# Patient Record
Sex: Male | Born: 1965 | Race: White | Hispanic: No | Marital: Married | Smoking: Current every day smoker
Health system: Southern US, Community
[De-identification: ages and names within clinical notes are randomized; demographics above are authoritative.]

## PROBLEM LIST (undated history)

## (undated) DIAGNOSIS — F1010 Alcohol abuse, uncomplicated: Secondary | ICD-10-CM

## (undated) DIAGNOSIS — I259 Chronic ischemic heart disease, unspecified: Secondary | ICD-10-CM

## (undated) DIAGNOSIS — Z720 Tobacco use: Secondary | ICD-10-CM

## (undated) DIAGNOSIS — E785 Hyperlipidemia, unspecified: Secondary | ICD-10-CM

## (undated) DIAGNOSIS — I10 Essential (primary) hypertension: Secondary | ICD-10-CM

## (undated) HISTORY — DX: Alcohol abuse, uncomplicated: F10.10

## (undated) HISTORY — DX: Hyperlipidemia, unspecified: E78.5

## (undated) HISTORY — DX: Tobacco use: Z72.0

## (undated) HISTORY — DX: Chronic ischemic heart disease, unspecified: I25.9

## (undated) HISTORY — PX: CARDIAC CATHETERIZATION: SHX172

---

## 2001-07-07 ENCOUNTER — Emergency Department (HOSPITAL_COMMUNITY): Admission: EM | Admit: 2001-07-07 | Discharge: 2001-07-08 | Payer: Self-pay

## 2005-12-26 ENCOUNTER — Encounter: Admission: RE | Admit: 2005-12-26 | Discharge: 2005-12-26 | Payer: Self-pay | Admitting: Endocrinology

## 2007-11-25 ENCOUNTER — Encounter: Admission: RE | Admit: 2007-11-25 | Discharge: 2007-11-25 | Payer: Self-pay | Admitting: Endocrinology

## 2008-01-16 ENCOUNTER — Encounter: Admission: RE | Admit: 2008-01-16 | Discharge: 2008-01-16 | Payer: Self-pay | Admitting: Gastroenterology

## 2009-03-12 ENCOUNTER — Inpatient Hospital Stay (HOSPITAL_COMMUNITY): Admission: EM | Admit: 2009-03-12 | Discharge: 2009-03-14 | Payer: Self-pay | Admitting: Emergency Medicine

## 2009-03-12 ENCOUNTER — Ambulatory Visit: Payer: Self-pay | Admitting: Internal Medicine

## 2009-03-21 ENCOUNTER — Telehealth: Payer: Self-pay | Admitting: Internal Medicine

## 2009-03-24 ENCOUNTER — Ambulatory Visit: Payer: Self-pay | Admitting: Internal Medicine

## 2009-03-24 DIAGNOSIS — R002 Palpitations: Secondary | ICD-10-CM

## 2009-03-24 DIAGNOSIS — E785 Hyperlipidemia, unspecified: Secondary | ICD-10-CM | POA: Insufficient documentation

## 2009-03-24 DIAGNOSIS — I10 Essential (primary) hypertension: Secondary | ICD-10-CM

## 2009-03-24 DIAGNOSIS — R079 Chest pain, unspecified: Secondary | ICD-10-CM | POA: Insufficient documentation

## 2009-04-18 ENCOUNTER — Telehealth: Payer: Self-pay | Admitting: Internal Medicine

## 2010-02-09 NOTE — Progress Notes (Signed)
Summary: pt needs refill  Phone Note Refill Request Call back at Work Phone [phone] Message from:  [facility] on [address] [phone]  Refills Requested: Medication #1:  METOPROLOL TARTRATE 25 MG TABS Take one tablet by mouth twice a day Initial call taken by: Omer Jack,  April 18, 2009 11:48 AM    Prescriptions: METOPROLOL TARTRATE 25 MG TABS (METOPROLOL TARTRATE) Take one tablet by mouth twice a day  #0 x 0   Entered by:   Laurance Flatten CMA   Authorized by:   Laren Boom, MD, [facility]   Signed by:   Laurance Flatten CMA on 04/18/2009   Method used:   Print then Give to Patient   RxID:   [id number]

## 2010-02-09 NOTE — Assessment & Plan Note (Signed)
Summary: eph/post cath/eval for monitor   Visit Type:  Initial Consult Primary Provider:  Juleen China, MD   History of Present Illness: Mr. Varnell returns today for followup.  He is a pleasant 45 yo man with a h/o tachypalpitations associated with chest pain who underwent left heart catheterization which demonstrated no obstructive disease.  He has been stable.  He has been trying to stop smoking and lose weight. He has not had any increased heart rates since discharge and has gone back to work.  Current Medications (verified): 1)  Metoprolol Tartrate 25 Mg Tabs (Metoprolol Tartrate) .... Take One Tablet By Mouth Twice A Day 2)  Cyclobenzaprine Hcl 10 Mg Tabs (Cyclobenzaprine Hcl) .... Three Times A Day As Needed 3)  Lorazepam 1 Mg Tabs (Lorazepam) .... At Bedtime 4)  Multivitamins   Tabs (Multiple Vitamin) .... Take One Tablet By Mouth Once Daily. 5)  Nexium 40 Mg Cpdr (Esomeprazole Magnesium) .Marland Kitchen.. 1 Capsule Once Daily 6)  Simcor 500-20 Mg Xr24h-Tab (Niacin-Simvastatin) .... Take One Tablet By Mouth Once Daily. 7)  Testim 1 % Gel (Testosterone) .... Once Daily 8)  Vitamin B-12 1000 Mcg Tabs (Cyanocobalamin) .... Take One Tablet By Mouth Once Daily.  Allergies (verified): No Known Drug Allergies  Review of Systems  The patient denies chest pain, syncope, dyspnea on exertion, and peripheral edema.    Vital Signs:  Patient profile:   45 year old male Height:      72 inches Weight:      222 pounds BMI:     30.22 Pulse rate:   72 / minute BP sitting:   120 / 70  (left arm)  Vitals Entered By: Laurance Flatten CMA (March 24, 2009 2:10 PM)  Physical Exam  General:  Well developed, well nourished, in no acute distress.  HEENT: normal Neck: supple. No JVD. Carotids 2+ bilaterally no bruits Cor: RRR no rubs, gallops or murmur Lungs: CTA Ab: soft, nontender. nondistended. No HSM. Good bowel sounds Ext: warm. no cyanosis, clubbing or edema Neuro: alert and oriented. Grossly nonfocal. affect  pleasant    EKG  Procedure date:  03/24/2009  Findings:      Normal sinus rhythm with rate of:  72.  Impression & Recommendations:  Problem # 1:  PALPITATIONS (ICD-785.1) Most likely the patient has SVT but we have not been able to document an episode.  I have recommended he go to the firestation or to our office if he has more symptoms and to record his heart rate and blood pressure if and when he goes back to atrial fibrillation. His updated medication list for this problem includes:    Metoprolol Tartrate 25 Mg Tabs (Metoprolol tartrate) .Marland Kitchen... Take one tablet by mouth twice a day  Problem # 2:  ESSENTIAL HYPERTENSION, BENIGN (ICD-401.1) His blood pressure today is well controlled.  He may be able to stop his beta blocker if he can lose some additional weight. His updated medication list for this problem includes:    Metoprolol Tartrate 25 Mg Tabs (Metoprolol tartrate) .Marland Kitchen... Take one tablet by mouth twice a day  Problem # 3:  DYSLIPIDEMIA (ICD-272.4) A low fat diet and continued medical therapy have been recommended. His updated medication list for this problem includes:    Simcor 500-20 Mg Xr24h-tab (Niacin-simvastatin) .Marland Kitchen... Take one tablet by mouth once daily.  Patient Instructions: 1)  Your physician recommends that you schedule a follow-up appointment in: 3 to 4 months with Dr Ladona Ridgel 2)  Your physician recommends that you continue  on your current medications as directed. Please refer to the Current Medication list given to you today.

## 2010-02-09 NOTE — Progress Notes (Signed)
Summary: post cath/groin knot  Phone Note Call from Patient Call back at Work Phone [phone]   Caller: Patient Reason for Call: Talk to Nurse Summary of Call: post cath last Monday3/7, has knot @ cath site; about a inch long Initial call taken by: Migdalia Dk,  March 21, 2009 11:00 AM  Follow-up for Phone Call        spoke with pt, he has a knot at the cath site in the groin. he noticed it today, prior to today it has not been there. the site is alittle tender to the touch. he denies any other problems. Deliah Goody, RN  March 21, 2009 11:20 AM  discussed with dr Sanjuana Kava, pt to watch and let us know of any changes Deliah Goody, RN  March 21, 2009 11:26 AM

## 2010-04-02 LAB — LIPID PANEL
HDL: 31 mg/dL — ABNORMAL LOW (ref >39)
LDL Cholesterol: 64 mg/dL (ref 0–99)
Triglycerides: 282 mg/dL — ABNORMAL HIGH (ref <150)

## 2010-04-02 LAB — CARDIAC PANEL(CRET KIN+CKTOT+MB+TROPI)
CK, MB: 2.3 ng/mL (ref 0.3–4.0)
Relative Index: 1 (ref 0.0–2.5)
Relative Index: 1.1 (ref 0.0–2.5)
Troponin I: 0.04 ng/mL (ref 0.00–0.06)
Troponin I: 0.05 ng/mL (ref 0.00–0.06)

## 2010-04-02 LAB — TROPONIN I: Troponin I: 0.13 ng/mL — ABNORMAL HIGH (ref 0.00–0.06)

## 2010-04-02 LAB — BASIC METABOLIC PANEL
BUN: 12 mg/dL (ref 6–23)
Chloride: 104 mEq/L (ref 96–112)
Chloride: 108 mEq/L (ref 96–112)
Chloride: 108 mEq/L (ref 96–112)
GFR calc Af Amer: >60 mL/min (ref >60)
GFR calc Af Amer: >60 mL/min (ref >60)
GFR calc non Af Amer: >60 mL/min (ref >60)
Glucose, Bld: 98 mg/dL (ref 70–99)
Potassium: 3.8 mEq/L (ref 3.5–5.1)
Potassium: 3.9 mEq/L (ref 3.5–5.1)
Potassium: 4.2 mEq/L (ref 3.5–5.1)
Sodium: 137 mEq/L (ref 135–145)
Sodium: 138 mEq/L (ref 135–145)
Sodium: 138 mEq/L (ref 135–145)

## 2010-04-02 LAB — CBC
HCT: 44.5 % (ref 39.0–52.0)
HCT: 48.1 % (ref 39.0–52.0)
Hemoglobin: 16.6 g/dL (ref 13.0–17.0)
MCV: 94.9 fL (ref 78.0–100.0)
Platelets: 164 10*3/uL (ref 150–400)
RBC: 5.07 MIL/uL (ref 4.22–5.81)
RDW: 13 % (ref 11.5–15.5)
WBC: 11.6 10*3/uL — ABNORMAL HIGH (ref 4.0–10.5)
WBC: 8.8 10*3/uL (ref 4.0–10.5)

## 2010-04-02 LAB — CK TOTAL AND CKMB (NOT AT ARMC)
CK, MB: 2.5 ng/mL (ref 0.3–4.0)
Total CK: 253 U/L — ABNORMAL HIGH (ref 7–232)

## 2010-04-02 LAB — DIFFERENTIAL
Eosinophils Absolute: 0.4 10*3/uL (ref 0.0–0.7)
Eosinophils Relative: 3 % (ref 0–5)
Lymphs Abs: 2.6 10*3/uL (ref 0.7–4.0)
Monocytes Relative: 8 % (ref 3–12)

## 2010-04-02 LAB — PROTIME-INR
INR: 0.97 (ref 0.00–1.49)
Prothrombin Time: 12.8 seconds (ref 11.6–15.2)

## 2010-04-02 LAB — POCT CARDIAC MARKERS
Troponin i, poc: <0.05 ng/mL (ref 0.00–0.09)
Troponin i, poc: <0.05 ng/mL (ref 0.00–0.09)

## 2010-04-02 LAB — TSH: TSH: 1.591 u[IU]/mL (ref 0.350–4.500)

## 2010-04-02 LAB — D-DIMER, QUANTITATIVE: D-Dimer, Quant: <0.22 ug/mL-FEU (ref 0.00–0.48)

## 2010-09-07 ENCOUNTER — Other Ambulatory Visit: Payer: Self-pay | Admitting: Gastroenterology

## 2010-09-08 ENCOUNTER — Ambulatory Visit
Admission: RE | Admit: 2010-09-08 | Discharge: 2010-09-08 | Disposition: A | Discharge status: Home / Self Care | Payer: PRIVATE HEALTH INSURANCE | Source: Ambulatory Visit | Attending: Gastroenterology | Admitting: Gastroenterology

## 2011-09-18 ENCOUNTER — Emergency Department (HOSPITAL_COMMUNITY)
Admission: EM | Admit: 2011-09-18 | Discharge: 2011-09-18 | Disposition: A | Discharge status: Home / Self Care | Payer: PRIVATE HEALTH INSURANCE | Attending: Emergency Medicine | Admitting: Emergency Medicine

## 2011-09-18 ENCOUNTER — Encounter (HOSPITAL_COMMUNITY): Payer: Self-pay | Admitting: *Deleted

## 2011-09-18 ENCOUNTER — Emergency Department (HOSPITAL_COMMUNITY): Payer: PRIVATE HEALTH INSURANCE

## 2011-09-18 DIAGNOSIS — S63501A Unspecified sprain of right wrist, initial encounter: Secondary | ICD-10-CM

## 2011-09-18 DIAGNOSIS — I10 Essential (primary) hypertension: Secondary | ICD-10-CM | POA: Insufficient documentation

## 2011-09-18 DIAGNOSIS — Y998 Other external cause status: Secondary | ICD-10-CM | POA: Insufficient documentation

## 2011-09-18 DIAGNOSIS — S40019A Contusion of unspecified shoulder, initial encounter: Secondary | ICD-10-CM | POA: Insufficient documentation

## 2011-09-18 DIAGNOSIS — S63509A Unspecified sprain of unspecified wrist, initial encounter: Secondary | ICD-10-CM | POA: Insufficient documentation

## 2011-09-18 DIAGNOSIS — S40012A Contusion of left shoulder, initial encounter: Secondary | ICD-10-CM

## 2011-09-18 DIAGNOSIS — Y9389 Activity, other specified: Secondary | ICD-10-CM | POA: Insufficient documentation

## 2011-09-18 DIAGNOSIS — F17200 Nicotine dependence, unspecified, uncomplicated: Secondary | ICD-10-CM | POA: Insufficient documentation

## 2011-09-18 DIAGNOSIS — W208XXA Other cause of strike by thrown, projected or falling object, initial encounter: Secondary | ICD-10-CM | POA: Insufficient documentation

## 2011-09-18 HISTORY — DX: Essential (primary) hypertension: I10

## 2011-09-18 MED ORDER — OXYCODONE HCL 5 MG PO TABS: 5.0000 mg | ORAL_TABLET | ORAL | Status: AC | PRN

## 2011-09-18 NOTE — ED Notes (Signed)
He was at work and a heavy machine fell on him earlier today.  He has pain in his rt wrist and his lt clavicle

## 2011-09-18 NOTE — ED Notes (Signed)
EDPA at [facility]. Pt alert, NAD, calm, interactive. Imaging results reviewed.

## 2011-09-18 NOTE — ED Notes (Signed)
Pt seen by EDPA prior to RN assessment, see PA notes, orders received and initiated. Splint placed by orthotech. Here for blunt trauma to R wrist. Skin intact, no bruising, redness or markings noted. Swelling present, skin tight in anterior wrist area. CMS intact, radial and ulnar pulses present, cap refill <2sec. Reports moderate pain.

## 2011-09-18 NOTE — ED Provider Notes (Signed)
History     CSN: [id number]  Arrival date & time 09/18/11  1736   First MD Initiated Contact with Patient 09/18/11 2005      Chief Complaint  Patient presents with  . Wrist Pain    (Consider location/radiation/quality/duration/timing/severity/associated sxs/prior treatment) HPI History provided by pt.   Pt was moving a refrigerator at 5pm today, it tipped backwards, patient fell to ground, landing on his back, and refrigerator landed on his right wrist.  C/o pain in right wrist as well as left shoulder.  Denies weakness/paresthesias.  Took three shots of vodka with some relief of the pain.  Hit head but no LOC and denies headache, dizziness, blurred vision and N/V.  Denies neck/back pain.    Past Medical History  Diagnosis Date  . Hypertension     History reviewed. No pertinent past surgical history.  No family history on file.  History  Substance Use Topics  . Smoking status: Current Everyday Smoker  . Smokeless tobacco: Not on file  . Alcohol Use: Yes      Review of Systems  All other systems reviewed and are negative.    Allergies  Tylenol  Home Medications   Current Outpatient Rx  Name Route Sig Dispense Refill  . ARIPIPRAZOLE 2 MG PO TABS Oral Take 2 mg by mouth daily.    . CELECOXIB 200 MG PO CAPS Oral Take 200 mg by mouth daily.    Marland Kitchen LORAZEPAM 1 MG PO TABS Oral Take 1 mg by mouth every 12 (twelve) hours as needed. For anxiety    . SERTRALINE HCL 100 MG PO TABS Oral Take 150 mg by mouth daily.    . TESTOSTERONE 50 MG/5GM TD GEL Transdermal Place 5 g onto the skin daily.      BP 116/75  Pulse 94  Temp 97.9 F (36.6 C) (Oral)  Resp 18  SpO2 99%  Physical Exam  Nursing note and vitals reviewed. Constitutional: He is oriented to person, place, and time. He appears well-developed and well-nourished. No distress.  HENT:  Head: Normocephalic and atraumatic.  Eyes:       Normal appearance  Neck: Normal range of motion.  Cardiovascular: Normal rate,  regular rhythm and intact distal pulses.   Pulmonary/Chest: Effort normal and breath sounds normal.  Musculoskeletal: Normal range of motion.       Edema and mild ecchymosis on flexor surface of right distal forearm.  Full, active ROM of wrist but pain w/ flexion and supination.  Strength testing limited by pain.  Left clavicle tender and there is a palpable knot at the center.  Full passive ROM shoulder w/ minimal pain.  All other joints of upper extremities normal.  2+ radial pulses and sensation intact.  Entire spine non-tender.    Neurological: He is alert and oriented to person, place, and time. No sensory deficit. Coordination normal.       CN 3-12 intact.  No nystagmus. 5/5 and equal upper and lower extremity strength.  No past pointing.     Skin: Skin is warm and dry. No rash noted.  Psychiatric: He has a normal mood and affect. His behavior is normal.    ED Course  Procedures (including critical care time)  Labs Reviewed - No data to display Dg Clavicle Left  09/18/2011  *RADIOLOGY REPORT*  Clinical Data: Fall.  Left clavicle pain.  LEFT CLAVICLE - 2+ VIEWS  Comparison: Two-view chest x-ray 07/02/2011.  Findings: The left clavicle is intact.  Mild degenerative changes  in the left [facility] joint are stable.  IMPRESSION:  1.  No acute fracture. 2.  Mild degenerative changes of the left AC joint.   Original Report Authenticated By: Jamesetta Orleans. MATTERN, M.D.    Dg Wrist Complete Right  09/18/2011  *RADIOLOGY REPORT*  Clinical Data: Fall.  Right wrist pain.  RIGHT WRIST - COMPLETE 3+ VIEW  Comparison: None.  Findings: The right wrist is located.  No acute bone or soft tissue abnormality is present.  IMPRESSION: Negative right wrist.   Original Report Authenticated By: Jamesetta Orleans. MATTERN, M.D.      1. Sprain of right wrist   2. Contusion of left shoulder       MDM  46yo M fell and refrigerator landed on right wrist today.  C/o right wrist and left clavicle pain.  Xrays negative for  fx/dislocation and NS intact and no signs of compartment syndrome.  Ortho tech placed right wrist in velcrow splint and pt d/c'd home w/ 15 oxycodone (can not taken NSAIDs/tylenol) and referral to hand for persistent/worsening sx.  Return precautions discussed.         Arie Sabina [place], [place] 09/18/11 2028

## 2011-09-18 NOTE — Progress Notes (Signed)
Orthopedic Tech Progress Note Patient Details:  Corey Archer Aug 11, 1965 [id number]  Ortho Devices Type of Ortho Device: Velcro wrist splint Ortho Device/Splint Location: right  wrist Ortho Device/Splint Interventions: Application   Corey Archer 09/18/2011, 9:00 PM

## 2011-09-20 NOTE — ED Provider Notes (Signed)
Medical screening examination/treatment/procedure(s) were performed by non-physician practitioner and as supervising physician I was immediately available for consultation/collaboration.   Aaleah Hirsch, MD 09/20/11 0009 

## 2014-10-19 ENCOUNTER — Encounter (HOSPITAL_COMMUNITY): Payer: Self-pay | Admitting: Emergency Medicine

## 2014-10-19 ENCOUNTER — Emergency Department (HOSPITAL_COMMUNITY): Payer: BLUE CROSS/BLUE SHIELD

## 2014-10-19 ENCOUNTER — Emergency Department (HOSPITAL_COMMUNITY)
Admission: EM | Admit: 2014-10-19 | Discharge: 2014-10-19 | Disposition: A | Discharge status: Home / Self Care | Payer: BLUE CROSS/BLUE SHIELD | Attending: Emergency Medicine | Admitting: Emergency Medicine

## 2014-10-19 DIAGNOSIS — R079 Chest pain, unspecified: Secondary | ICD-10-CM | POA: Insufficient documentation

## 2014-10-19 DIAGNOSIS — Z792 Long term (current) use of antibiotics: Secondary | ICD-10-CM | POA: Insufficient documentation

## 2014-10-19 DIAGNOSIS — Z720 Tobacco use: Secondary | ICD-10-CM | POA: Diagnosis not present

## 2014-10-19 DIAGNOSIS — Z79899 Other long term (current) drug therapy: Secondary | ICD-10-CM | POA: Diagnosis not present

## 2014-10-19 DIAGNOSIS — I10 Essential (primary) hypertension: Secondary | ICD-10-CM | POA: Insufficient documentation

## 2014-10-19 DIAGNOSIS — Z9889 Other specified postprocedural states: Secondary | ICD-10-CM | POA: Insufficient documentation

## 2014-10-19 LAB — CBC
HCT: 46.9 % (ref 39.0–52.0)
HEMOGLOBIN: 16.5 g/dL (ref 13.0–17.0)
MCH: 31.5 pg (ref 26.0–34.0)
MCHC: 35.2 g/dL (ref 30.0–36.0)
MCV: 89.5 fL (ref 78.0–100.0)
Platelets: 166 10*3/uL (ref 150–400)
RBC: 5.24 MIL/uL (ref 4.22–5.81)
RDW: 12.1 % (ref 11.5–15.5)
WBC: 7.8 10*3/uL (ref 4.0–10.5)

## 2014-10-19 LAB — URINALYSIS, ROUTINE W REFLEX MICROSCOPIC
BILIRUBIN URINE: NEGATIVE
Glucose, UA: NEGATIVE mg/dL
HGB URINE DIPSTICK: NEGATIVE
KETONES UR: NEGATIVE mg/dL
Leukocytes, UA: NEGATIVE
NITRITE: NEGATIVE
PROTEIN: NEGATIVE mg/dL
SPECIFIC GRAVITY, URINE: 1.004 — AB (ref 1.005–1.030)
Urobilinogen, UA: 0.2 mg/dL (ref 0.0–1.0)
pH: 7 (ref 5.0–8.0)

## 2014-10-19 LAB — BASIC METABOLIC PANEL
ANION GAP: 7 (ref 5–15)
BUN: 11 mg/dL (ref 6–20)
CALCIUM: 9.1 mg/dL (ref 8.9–10.3)
CO2: 26 mmol/L (ref 22–32)
Chloride: 105 mmol/L (ref 101–111)
Creatinine, Ser: 0.93 mg/dL (ref 0.61–1.24)
GFR calc non Af Amer: >60 mL/min (ref >60)
Glucose, Bld: 93 mg/dL (ref 65–99)
Potassium: 4.6 mmol/L (ref 3.5–5.1)
SODIUM: 138 mmol/L (ref 135–145)

## 2014-10-19 LAB — I-STAT TROPONIN, ED
TROPONIN I, POC: 0 ng/mL (ref 0.00–0.08)
TROPONIN I, POC: 0.01 ng/mL (ref 0.00–0.08)

## 2014-10-19 NOTE — ED Provider Notes (Signed)
CSN: [id number]     Arrival date & time 10/19/14  4098 History   First MD Initiated Contact with Patient 10/19/14 [phone]     Chief Complaint  Patient presents with  . Chest Pain  . Hypertension     (Consider location/radiation/quality/duration/timing/severity/associated sxs/prior Treatment) Patient is a 49 y.o. male presenting with chest pain.  Chest Pain Pain location:  L chest Pain quality: shooting   Pain radiates to:  Does not radiate Pain radiates to the back: no   Pain severity:  Moderate Onset quality:  Gradual Timing:  Intermittent Progression:  Waxing and waning Chronicity:  New Relieved by:  Nothing Worsened by:  Nothing tried Associated symptoms: no fever, no nausea, no shortness of breath and not vomiting     Past Medical History  Diagnosis Date  . Hypertension    Past Surgical History  Procedure Laterality Date  . Cardiac catheterization     No family history on file. Social History  Substance Use Topics  . Smoking status: Current Every Day Smoker  . Smokeless tobacco: None  . Alcohol Use: Yes    Review of Systems  Constitutional: Negative for fever.  Respiratory: Negative for shortness of breath.   Cardiovascular: Positive for chest pain.  Gastrointestinal: Negative for nausea and vomiting.  All other systems reviewed and are negative.     Allergies  Motrin and Tylenol  Home Medications   Prior to Admission medications   Medication Sig Start Date End Date Taking? Authorizing Provider  celecoxib (CELEBREX) 200 MG capsule Take 200 mg by mouth daily.   Yes Historical Provider, MD  clarithromycin (BIAXIN XL) 500 MG 24 hr tablet Take 500 mg by mouth 2 (two) times daily. 10 day supply started 10/3 10/11/14  Yes Historical Provider, MD  cyclobenzaprine (FLEXERIL) 10 MG tablet Take 10 mg by mouth 3 (three) times daily as needed for muscle spasms.   Yes Historical Provider, MD  esomeprazole (NEXIUM) 40 MG capsule Take 40 mg by mouth daily at 12 noon.    Yes Historical Provider, MD  hydrOXYzine (ATARAX/VISTARIL) 25 MG tablet Take 25 mg by mouth at bedtime.   Yes Historical Provider, MD  LORazepam (ATIVAN) 1 MG tablet Take 1 mg by mouth at bedtime. For anxiety   Yes Historical Provider, MD  metoprolol tartrate (LOPRESSOR) 25 MG tablet Take 25 mg by mouth 2 (two) times daily.   Yes Historical Provider, MD  testosterone (ANDROGEL) 50 MG/5GM GEL Place 5 g onto the skin daily.   Yes Historical Provider, MD   BP 155/92 mmHg  Pulse 76  Temp(Src) 98.4 F (36.9 C) (Oral)  Resp 16  Ht 6' (1.829 m)  Wt 220 lb (99.791 kg)  BMI 29.83 kg/m2  SpO2 98% Physical Exam  Constitutional: He is oriented to person, place, and time. He appears well-developed and well-nourished.  HENT:  Head: Normocephalic and atraumatic.  Eyes: Conjunctivae and EOM are normal.  Neck: Normal range of motion. Neck supple.  Cardiovascular: Normal rate, regular rhythm and normal heart sounds.   Pulmonary/Chest: Effort normal and breath sounds normal. No respiratory distress.  Abdominal: He exhibits no distension. There is no tenderness. There is no rebound and no guarding.  Musculoskeletal: Normal range of motion.  Neurological: He is alert and oriented to person, place, and time.  Skin: Skin is warm and dry.  Vitals reviewed.   ED Course  Procedures (including critical care time) Labs Review Labs Reviewed  URINALYSIS, ROUTINE W REFLEX MICROSCOPIC (NOT AT [facility]) - Abnormal;  Notable for the following:    Specific Gravity, Urine 1.004 (*)    All other components within normal limits  BASIC METABOLIC PANEL  CBC  I-STAT TROPOININ, ED  I-STAT TROPOININ, ED    Imaging Review No results found. I have personally reviewed and evaluated these images and lab results as part of my medical decision-making.   EKG Interpretation   Date/Time:  Tuesday October 19 2014 09:25:35 EDT Ventricular Rate:  76 PR Interval:  163 QRS Duration: 94 QT Interval:  350 QTC Calculation:  393 R Axis:   104 Text Interpretation:  Sinus rhythm Ventricular premature complex Right  axis deviation rate has decreased since last tracing Confirmed by Mirian Mo [phone]) on 10/19/2014 9:28:24 AM      MDM   Final diagnoses:  Chest pain, unspecified chest pain type    49 y.o. male with pertinent PMH of prior HTN (not currently on medication) presents with chest pain as above, as well as concern for HTN with BP systolic 180 at work.  Physical exam on arrival as above, pt asymptomatic.    Wu obtained as above and unremarkable.    I have reviewed all laboratory and imaging studies if ordered as above  1. Chest pain, unspecified chest pain type         Mirian Mo, MD 10/23/14 [phone]

## 2014-10-19 NOTE — Discharge Instructions (Signed)
Nonspecific Chest Pain  °Chest pain can be caused by many different conditions. There is always a chance that your pain could be related to something serious, such as a heart attack or a blood clot in your lungs. Chest pain can also be caused by conditions that are not life-threatening. If you have chest pain, it is very important to follow up with your health care provider. °CAUSES  °Chest pain can be caused by: °· Heartburn. °· Pneumonia or bronchitis. °· Anxiety or stress. °· Inflammation around your heart (pericarditis) or lung (pleuritis or pleurisy). °· A blood clot in your lung. °· A collapsed lung (pneumothorax). It can develop suddenly on its own (spontaneous pneumothorax) or from trauma to the chest. °· Shingles infection (varicella-zoster virus). °· Heart attack. °· Damage to the bones, muscles, and cartilage that make up your chest wall. This can include: °¨ Bruised bones due to injury. °¨ Strained muscles or cartilage due to frequent or repeated coughing or overwork. °¨ Fracture to one or more ribs. °¨ Sore cartilage due to inflammation (costochondritis). °RISK FACTORS  °Risk factors for chest pain may include: °· Activities that increase your risk for trauma or injury to your chest. °· Respiratory infections or conditions that cause frequent coughing. °· Medical conditions or overeating that can cause heartburn. °· Heart disease or family history of heart disease. °· Conditions or health behaviors that increase your risk of developing a blood clot. °· Having had chicken pox (varicella zoster). °SIGNS AND SYMPTOMS °Chest pain can feel like: °· Burning or tingling on the surface of your chest or deep in your chest. °· Crushing, pressure, aching, or squeezing pain. °· Dull or sharp pain that is worse when you move, cough, or take a deep breath. °· Pain that is also felt in your back, neck, shoulder, or arm, or pain that spreads to any of these areas. °Your chest pain may come and go, or it may stay  constant. °DIAGNOSIS °Lab tests or other studies may be needed to find the cause of your pain. Your health care provider may have you take a test called an ambulatory ECG (electrocardiogram). An ECG records your heartbeat patterns at the time the test is performed. You may also have other tests, such as: °· Transthoracic echocardiogram (TTE). During echocardiography, sound waves are used to create a picture of all of the heart structures and to look at how blood flows through your heart. °· Transesophageal echocardiogram (TEE). This is a more advanced imaging test that obtains images from inside your body. It allows your health care provider to see your heart in finer detail. °· Cardiac monitoring. This allows your health care provider to monitor your heart rate and rhythm in real time. °· Holter monitor. This is a portable device that records your heartbeat and can help to diagnose abnormal heartbeats. It allows your health care provider to track your heart activity for several days, if needed. °· Stress tests. These can be done through exercise or by taking medicine that makes your heart beat more quickly. °· Blood tests. °· Imaging tests. °TREATMENT  °Your treatment depends on what is causing your chest pain. Treatment may include: °· Medicines. These may include: °¨ Acid blockers for heartburn. °¨ Anti-inflammatory medicine. °¨ Pain medicine for inflammatory conditions. °¨ Antibiotic medicine, if an infection is present. °¨ Medicines to dissolve blood clots. °¨ Medicines to treat coronary artery disease. °· Supportive care for conditions that do not require medicines. This may include: °¨ Resting. °¨ Applying heat   or cold packs to injured areas. °¨ Limiting activities until pain decreases. °HOME CARE INSTRUCTIONS °· If you were prescribed an antibiotic medicine, finish it all even if you start to feel better. °· Avoid any activities that bring on chest pain. °· Do not use any tobacco products, including  cigarettes, chewing tobacco, or electronic cigarettes. If you need help quitting, ask your health care provider. °· Do not drink alcohol. °· Take medicines only as directed by your health care provider. °· Keep all follow-up visits as directed by your health care provider. This is important. This includes any further testing if your chest pain does not go away. °· If heartburn is the cause for your chest pain, you may be told to keep your head raised (elevated) while sleeping. This reduces the chance that acid will go from your stomach into your esophagus. °· Make lifestyle changes as directed by your health care provider. These may include: °¨ Getting regular exercise. Ask your health care provider to suggest some activities that are safe for you. °¨ Eating a heart-healthy diet. A registered dietitian can help you to learn healthy eating options. °¨ Maintaining a healthy weight. °¨ Managing diabetes, if necessary. °¨ Reducing stress. °SEEK MEDICAL CARE IF: °· Your chest pain does not go away after treatment. °· You have a rash with blisters on your chest. °· You have a fever. °SEEK IMMEDIATE MEDICAL CARE IF:  °· Your chest pain is worse. °· You have an increasing cough, or you cough up blood. °· You have severe abdominal pain. °· You have severe weakness. °· You faint. °· You have chills. °· You have sudden, unexplained chest discomfort. °· You have sudden, unexplained discomfort in your arms, back, neck, or jaw. °· You have shortness of breath at any time. °· You suddenly start to sweat, or your skin gets clammy. °· You feel nauseous or you vomit. °· You suddenly feel light-headed or dizzy. °· Your heart begins to beat quickly, or it feels like it is skipping beats. °These symptoms may represent a serious problem that is an emergency. Do not wait to see if the symptoms will go away. Get medical help right away. Call your local emergency services (911 in the [place] Do not drive yourself to the hospital. °  °This  information is not intended to replace advice given to you by your health care provider. Make sure you discuss any questions you have with your health care provider. °  °Document Released: 10/04/2004 Document Revised: 01/15/2014 Document Reviewed: 07/31/2013 °Elsevier Interactive Patient Education ©2016 [employer] ° °

## 2014-10-19 NOTE — ED Notes (Signed)
Onset one day ago chest pain and high blood pressure.  Took a total of 3 doses  tablets in 24 hours of metoprolol. Currently chest pain 0/10 states "comes and goes"

## 2014-10-19 NOTE — ED Notes (Signed)
Pt is in stable condition upon d/c and ambulates from ED. 

## 2014-10-19 NOTE — ED Notes (Signed)
Called phlebotomy and requested blood draw.

## 2014-11-04 DIAGNOSIS — I10 Essential (primary) hypertension: Secondary | ICD-10-CM | POA: Insufficient documentation

## 2014-11-04 DIAGNOSIS — R079 Chest pain, unspecified: Secondary | ICD-10-CM | POA: Insufficient documentation

## 2014-11-08 ENCOUNTER — Encounter: Payer: Self-pay | Admitting: Internal Medicine

## 2014-11-08 ENCOUNTER — Ambulatory Visit: Payer: PRIVATE HEALTH INSURANCE | Admitting: Internal Medicine

## 2014-11-08 ENCOUNTER — Ambulatory Visit (INDEPENDENT_AMBULATORY_CARE_PROVIDER_SITE_OTHER): Payer: BLUE CROSS/BLUE SHIELD | Admitting: Internal Medicine

## 2014-11-08 VITALS — BP 126/88 | HR 91 | Ht 72.0 in | Wt 223.1 lb

## 2014-11-08 DIAGNOSIS — E785 Hyperlipidemia, unspecified: Secondary | ICD-10-CM

## 2014-11-08 DIAGNOSIS — I10 Essential (primary) hypertension: Secondary | ICD-10-CM | POA: Diagnosis not present

## 2014-11-08 MED ORDER — METOPROLOL SUCCINATE ER 50 MG PO TB24: 50.0000 mg | ORAL_TABLET | Freq: Every day | ORAL | Status: DC

## 2014-11-08 NOTE — Patient Instructions (Signed)
Medication Instructions:  Your physician has recommended you make the following change in your medication:  1) START Toprol XL 50 mg tablet by mouth ONCE daily 2) STOP Lopressor   Labwork: none  Testing/Procedures: none  Follow-Up: Your physician wants you to follow-up in: 5 months with Dr. Tenny Crawoss. You will receive a reminder letter in the mail two months in advance. If you don't receive a letter, please call our office to schedule the follow-up appointment.   Any Other Special Instructions Will Be Listed Below (If Applicable).     If you need a refill on your cardiac medications before your next appointment, please call your pharmacy.

## 2014-11-08 NOTE — Progress Notes (Signed)
Cardiology Office Note   Date:  11/08/2014   ID:  Corey Archer, DOB [birth year], MRN [id number]  PCP:  Michiel Sites, MD  Cardiologist:   Dietrich Pates, MD      Pt presents for f/u of HTN  History of Present Illness: Corey Archer is a 49 y.o. male with a history of CP (minimal CAD at cath in 2011) tob use, EtOH, HL ,  Hx of esophageal ulcers.      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  BP 190/110  Nurse sugg that he go to ER>   Drove to ER  Didnt't feel discomfort heart.  Sent home from ER  Taking metoprolol   BP up at times 160/95.   BP is high at times   Takes nexium  No reflux  Still mokes 1/2 ppd.    Activity level:  Works in [employer] center.    Current Outpatient Prescriptions  Medication Sig Dispense Refill  . celecoxib (CELEBREX) 200 MG capsule Take 200 mg by mouth daily.    . cyclobenzaprine (FLEXERIL) 10 MG tablet Take 10 mg by mouth 3 (three) times daily as needed for muscle spasms.    Marland Kitchen esomeprazole (NEXIUM) 40 MG capsule Take 40 mg by mouth daily at 12 noon.    . hydrOXYzine (ATARAX/VISTARIL) 25 MG tablet Take 25 mg by mouth at bedtime.    Marland Kitchen LORazepam (ATIVAN) 1 MG tablet Take 1 mg by mouth at bedtime. For anxiety    . metoprolol tartrate (LOPRESSOR) 25 MG tablet Take 25 mg by mouth 2 (two) times daily.    Marland Kitchen testosterone (ANDROGEL) 50 MG/5GM GEL Place 5 g onto the skin daily.     No current facility-administered medications for this visit.    Allergies:   Motrin and Tylenol   Past Medical History  Diagnosis Date  . Hypertension   . Chest pain     Past Surgical History  Procedure Laterality Date  . Cardiac catheterization       Social History:  The patient  reports that he has been smoking.  He has quit using smokeless tobacco. His smokeless tobacco use included Chew. He reports that he drinks alcohol.   Family History:  The patient's family history includes  Arthritis/Rheumatoid in his mother; COPD in his mother; Congestive Heart Failure in his mother; Diabetes in his mother; Emphysema in his mother; Healthy in his brother; Hypertension in his father; Kidney disease in his father.    ROS:  Please see the history of present illness. All other systems are reviewed and  Negative to the above problem except as noted.    PHYSICAL EXAM: VS:  BP 126/88 mmHg  Pulse 91  Ht 6' (1.829 m)  Wt 223 lb 1.9 oz (101.207 kg)  BMI 30.25 kg/m2  SpO2 97%  GEN: Well nourished, well developed, in no acute distress HEENT: normal Neck: no JVD, carotid bruits, or masses Cardiac: RRR; no murmurs, rubs, or gallops,no edema  Respiratory:  clear to auscultation bilaterally, normal work of breathing GI: soft, nontender, nondistended, + BS  No hepatomegaly  MS: no deformity Moving all extremities   Skin: warm and dry, no rash Neuro:  Strength and sensation are intact Psych: euthymic mood, full affect   EKG:  EKG is not ordered today.  Earlier this month SR 76 bpm  Occasional PVC     Lipid Panel  Component Value Date/Time   CHOL  03/13/2009 0230    151        ATP III CLASSIFICATION:  <200     mg/dL   Desirable  161-096200-239  mg/dL   Borderline High  >=045>=240    mg/dL   High          TRIG 409282* 03/13/2009 0230   HDL 31* 03/13/2009 0230   CHOLHDL 4.9 03/13/2009 0230   VLDL 56* 03/13/2009 0230   LDLCALC  03/13/2009 0230    64        Total Cholesterol/HDL:CHD Risk Coronary Heart Disease Risk Table                     Men   Women  1/2 Average Risk   3.4   3.3  Average Risk       5.0   4.4  2 X Average Risk   9.6   7.1  3 X Average Risk  23.4   11.0        Use the calculated Patient Ratio above and the CHD Risk Table to determine the patient's CHD Risk.        ATP III CLASSIFICATION (LDL):  <100     mg/dL   Optimal  811-914100-129  mg/dL   Near or Above                    Optimal  130-159  mg/dL   Borderline  782-956160-189  mg/dL   High  >213>190     mg/dL   Very High       Wt Readings from Last 3 Encounters:  11/08/14 223 lb 1.9 oz (101.207 kg)  10/19/14 220 lb (99.791 kg)  03/24/09 222 lb (100.699 kg)      ASSESSMENT AND PLAN:  1.  HTN  BP is better than several wks ago  Would switch metoprolol to XL 50 mg   F/U in medicine clnic in a few wks  If still high could consider another agent (Maxzide)  2  Dyslpidemia  Trig elevated  Watch carbs.    Signed, Dietrich PatesPaula Vannia Pola, MD  11/08/2014 3:09 PM    [facility] [address], [place], [place]  [address] Phone: [phone](336) [phone]; Fax: [phone]) [phone]

## 2015-05-03 ENCOUNTER — Other Ambulatory Visit: Payer: Self-pay

## 2015-05-03 MED ORDER — METOPROLOL SUCCINATE ER 50 MG PO TB24: 50.0000 mg | ORAL_TABLET | Freq: Every day | ORAL | Status: DC

## 2015-05-12 ENCOUNTER — Ambulatory Visit (INDEPENDENT_AMBULATORY_CARE_PROVIDER_SITE_OTHER): Payer: BLUE CROSS/BLUE SHIELD | Admitting: Internal Medicine

## 2015-05-12 ENCOUNTER — Encounter: Payer: Self-pay | Admitting: Internal Medicine

## 2015-05-12 VITALS — BP 152/100 | HR 94 | Ht 72.0 in | Wt 222.0 lb

## 2015-05-12 DIAGNOSIS — I10 Essential (primary) hypertension: Secondary | ICD-10-CM

## 2015-05-12 DIAGNOSIS — E785 Hyperlipidemia, unspecified: Secondary | ICD-10-CM | POA: Diagnosis not present

## 2015-05-12 MED ORDER — HYDROCHLOROTHIAZIDE 12.5 MG PO CAPS: 12.5000 mg | ORAL_CAPSULE | Freq: Every day | ORAL | Status: DC

## 2015-05-12 MED ORDER — METOPROLOL SUCCINATE ER 50 MG PO TB24: ORAL_TABLET | ORAL | Status: DC

## 2015-05-12 NOTE — Progress Notes (Signed)
Cardiology Office Note   Date:  05/14/2015   ID:  Corey Archer, DOB 11/19/1965, MRN [id number]  PCP:  Michiel SitesKOHUT,WALTER DENNIS, MD  Cardiologist:   Dietrich PatesPaula Kamsiyochukwu Spickler, MD   F/U of HTN and HL      History of Present Illness: Corey Archer is a 50 y.o. male with a history of minima CAD by cath in 2011, tobaccoa abuse  EtoH  HL and CP   I saw him in clinic in )ctober 2016  BP high at time     SInce seen breathing is good  Gets rare CP  Sharp  Fleeting  Not associatd with activity  He is active at work       Outpatient Prescriptions Prior to Visit  Medication Sig Dispense Refill  . celecoxib (CELEBREX) 200 MG capsule Take 200 mg by mouth daily.    . cyclobenzaprine (FLEXERIL) 10 MG tablet Take 10 mg by mouth 3 (three) times daily as needed for muscle spasms.    . hydrOXYzine (ATARAX/VISTARIL) 25 MG tablet Take 25 mg by mouth at bedtime.    Marland Kitchen. LORazepam (ATIVAN) 1 MG tablet Take 1 mg by mouth at bedtime. For anxiety    . metoprolol succinate (TOPROL XL) 50 MG 24 hr tablet Take 1 tablet (50 mg total) by mouth daily. Take with or immediately following a meal. 90 tablet 0  . esomeprazole (NEXIUM) 40 MG capsule Take 40 mg by mouth daily at 12 noon. Reported on 05/12/2015    . testosterone (ANDROGEL) 50 MG/5GM GEL Place 5 g onto the skin daily. Reported on 05/12/2015     No facility-administered medications prior to visit.     Allergies:   Motrin and Tylenol   Past Medical History  Diagnosis Date  . Hypertension   . Chest pain     Past Surgical History  Procedure Laterality Date  . Cardiac catheterization       Social History:  The patient  reports that he has been smoking.  He has quit using smokeless tobacco. His smokeless tobacco use included Chew. He reports that he drinks alcohol.   Family History:  The patient's family history includes Arthritis/Rheumatoid in his mother; COPD in his mother; Congestive Heart Failure in his mother; Diabetes in his mother; Emphysema in his mother;  Healthy in his brother; Hypertension in his father; Kidney disease in his father.    ROS:  Please see the history of present illness. All other systems are reviewed and  Negative to the above problem except as noted.    PHYSICAL EXAM: VS:  BP 152/100 mmHg  Pulse 94  Ht 6' (1.829 m)  Wt 222 lb (100.699 kg)  BMI 30.10 kg/m2  SpO2 98%  GEN: Well nourished, well developed, in no acute distress HEENT: normal Neck: no JVD, carotid bruits, or masses Cardiac: RRR; no murmurs, rubs, or gallops,no edema  Respiratory:  clear to auscultation bilaterally, normal work of breathing GI: soft, nontender, nondistended, + BS  No hepatomegaly  MS: no deformity Moving all extremities   Skin: warm and dry, no rash Neuro:  Strength and sensation are intact Psych: euthymic mood, full affect   EKG:  EKG is ordered today.   Lipid Panel    Component Value Date/Time   CHOL  03/13/2009 0230    151        ATP III CLASSIFICATION:  <200     mg/dL   Desirable  409-811200-239  mg/dL   Borderline High  >=914>=240    mg/dL  High          TRIG 282* 03/13/2009 0230   HDL 31* 03/13/2009 0230   CHOLHDL 4.9 03/13/2009 0230   VLDL 56* 03/13/2009 0230   LDLCALC  03/13/2009 0230    64        Total Cholesterol/HDL:CHD Risk Coronary Heart Disease Risk Table                     Men   Women  1/2 Average Risk   3.4   3.3  Average Risk       5.0   4.4  2 X Average Risk   9.6   7.1  3 X Average Risk  23.4   11.0        Use the calculated Patient Ratio above and the CHD Risk Table to determine the patient's CHD Risk.        ATP III CLASSIFICATION (LDL):  <100     mg/dL   Optimal  161-096  mg/dL   Near or Above                    Optimal  130-159  mg/dL   Borderline  045-409  mg/dL   High  >811     mg/dL   Very High      Wt Readings from Last 3 Encounters:  05/12/15 222 lb (100.699 kg)  11/08/14 223 lb 1.9 oz (101.207 kg)  10/19/14 220 lb (99.791 kg)      ASSESSMENT AND PLAN:  1  HTN  Still High  I would  recomm increasing toprol XL to 75 per day  Add HCTZ 12.5 mg  Get labs from Dr Otilio Carpen office   F/U in 4 to 6 wks    2  HL  Will get lipids from Dr Juleen China    3  Minimal CAD  4.  CP  Atypical    Signed, Dietrich Pates, MD  05/14/2015 5:04 PM    [facility] [address] [place], [place], [place]  [address] Phone: [phone]; Fax: [phone]

## 2015-05-12 NOTE — Patient Instructions (Signed)
Your physician has recommended you make the following change in your medication:  1.) increase Toprol XL to 75 (1 and half) tablets once a day 2.) start hydrocholorthiazide (hctz) 12.5 mg once a day  Your physician recommends that you schedule a follow-up appointment in: 4 weeks with Dr. Tenny Crawoss or PA/NP (same day as Dr. Tenny Crawoss is in clinic.)

## 2015-07-05 ENCOUNTER — Encounter: Payer: Self-pay | Admitting: Nurse Practitioner

## 2015-07-05 ENCOUNTER — Ambulatory Visit (INDEPENDENT_AMBULATORY_CARE_PROVIDER_SITE_OTHER): Payer: BLUE CROSS/BLUE SHIELD | Admitting: Nurse Practitioner

## 2015-07-05 VITALS — BP 138/90 | HR 80 | Ht 72.0 in | Wt 222.1 lb

## 2015-07-05 DIAGNOSIS — I259 Chronic ischemic heart disease, unspecified: Secondary | ICD-10-CM | POA: Diagnosis not present

## 2015-07-05 DIAGNOSIS — I10 Essential (primary) hypertension: Secondary | ICD-10-CM | POA: Diagnosis not present

## 2015-07-05 DIAGNOSIS — E785 Hyperlipidemia, unspecified: Secondary | ICD-10-CM | POA: Diagnosis not present

## 2015-07-05 LAB — BASIC METABOLIC PANEL
BUN: 16 mg/dL (ref 7–25)
CO2: 26 mmol/L (ref 20–31)
Calcium: 9.4 mg/dL (ref 8.6–10.3)
Chloride: 99 mmol/L (ref 98–110)
Creat: 1.04 mg/dL (ref 0.60–1.35)
Glucose, Bld: 110 mg/dL — ABNORMAL HIGH (ref 65–99)
Potassium: 4.4 mmol/L (ref 3.5–5.3)
Sodium: 134 mmol/L — ABNORMAL LOW (ref 135–146)

## 2015-07-05 NOTE — Progress Notes (Signed)
CARDIOLOGY OFFICE NOTE  Date:  07/05/2015    Corey Hawkingicky Saliba Date of Birth: September 20, 1965 Medical Record #[id number]  PCP:  Michiel SitesKOHUT,WALTER DENNIS, MD  Cardiologist:  Tenny Crawoss    Chief Complaint  Patient presents with  . Hypertension  . Hyperlipidemia  . Coronary Artery Disease    Follow up visit - seen for Dr. Tenny Crawoss    History of Present Illness: Corey Archer is a 50 y.o. male who presents today for a follow up visit. Seen for Dr. Tenny Crawoss.   He has a history of HTN, HLD, minimal CAD by remote cath back in 2011, and multi substance abuse.   Seen last month - BP up. Medicines adjusted with Toprol being increased and low dose HCTZ added.   Comes back today. Here alone. Says he is doing ok. No chest pain. Breathing ok. Not dizzy or lightheaded. BP running around 135/85 at home. Feels ok on his medicines. Still smoking. Continues to drink beer on a daily basis. Remains on chronic NSAID use.   Past Medical History  Diagnosis Date  . Hypertension   . Ischemic heart disease   . HLD (hyperlipidemia)   . Tobacco abuse   . Alcohol abuse     Past Surgical History  Procedure Laterality Date  . Cardiac catheterization       Medications: Current Outpatient Prescriptions  Medication Sig Dispense Refill  . celecoxib (CELEBREX) 200 MG capsule Take 200 mg by mouth daily.    . cyclobenzaprine (FLEXERIL) 10 MG tablet Take 10 mg by mouth 3 (three) times daily as needed for muscle spasms.    . hydrochlorothiazide (MICROZIDE) 12.5 MG capsule Take 1 capsule (12.5 mg total) by mouth daily. 30 capsule 1  . hydrOXYzine (ATARAX/VISTARIL) 25 MG tablet Take 25 mg by mouth at bedtime.    Marland Kitchen. LORazepam (ATIVAN) 1 MG tablet Take 1 mg by mouth at bedtime. For anxiety    . metoprolol succinate (TOPROL XL) 50 MG 24 hr tablet Take 1.5 tablets once a day 45 tablet 6  . pantoprazole (PROTONIX) 40 MG tablet Take 40 mg by mouth daily.    . ramipril (ALTACE) 10 MG capsule Take 10 mg by mouth daily.    .  Testosterone 20.25 MG/1.25GM (1.62%) GEL Place onto the skin. 4 pumps once daily    . tretinoin (RETIN-A) 0.1 % cream Apply 1 application topically at bedtime.     No current facility-administered medications for this visit.    Allergies: Allergies  Allergen Reactions  . Motrin [Ibuprofen]   . Tylenol [Acetaminophen] Other (See Comments)    Ulcers     Social History: The patient  reports that he has been smoking.  He has quit using smokeless tobacco. His smokeless tobacco use included Chew. He reports that he drinks alcohol.   Family History: The patient's family history includes Arthritis/Rheumatoid in his mother; COPD in his mother; Congestive Heart Failure in his mother; Diabetes in his mother; Emphysema in his mother; Healthy in his brother; Hypertension in his father; Kidney disease in his father.   Review of Systems: Please see the history of present illness.   Otherwise, the review of systems is positive for none.   All other systems are reviewed and negative.   Physical Exam: VS:  BP 138/90 mmHg  Pulse 80  Ht 6' (1.829 m)  Wt 222 lb 1.9 oz (100.753 kg)  BMI 30.12 kg/m2 .  BMI Body mass index is 30.12 kg/(m^2).  Wt Readings from Last 3 Encounters:  07/05/15 222 lb 1.9 oz (100.753 kg)  05/12/15 222 lb (100.699 kg)  11/08/14 223 lb 1.9 oz (101.207 kg)    General: Pleasant. Well developed, well nourished and in no acute distress.  HEENT: Normal but flushed face. Neck: Supple, no JVD, carotid bruits, or masses noted.  Cardiac: Regular rate and rhythm. No murmurs, rubs, or gallops. No edema.  Respiratory:  Lungs are clear to auscultation bilaterally with normal work of breathing.  GI: Soft and nontender.  MS: No deformity or atrophy. Gait and ROM intact. Skin: Warm and dry. Color is normal.  Neuro:  Strength and sensation are intact and no gross focal deficits noted.  Psych: Alert, appropriate and with normal affect.   LABORATORY DATA:  EKG:  EKG is not ordered  today.  Lab Results  Component Value Date   WBC 7.8 10/19/2014   HGB 16.5 10/19/2014   HCT 46.9 10/19/2014   PLT 166 10/19/2014   GLUCOSE 93 10/19/2014   CHOL  03/13/2009    151        ATP III CLASSIFICATION:  <200     mg/dL   Desirable  161-096200-239  mg/dL   Borderline High  >=045>=240    mg/dL   High          TRIG 409282* 03/13/2009   HDL 31* 03/13/2009   LDLCALC  03/13/2009    64        Total Cholesterol/HDL:CHD Risk Coronary Heart Disease Risk Table                     Men   Women  1/2 Average Risk   3.4   3.3  Average Risk       5.0   4.4  2 X Average Risk   9.6   7.1  3 X Average Risk  23.4   11.0        Use the calculated Patient Ratio above and the CHD Risk Table to determine the patient's CHD Risk.        ATP III CLASSIFICATION (LDL):  <100     mg/dL   Optimal  811-914100-129  mg/dL   Near or Above                    Optimal  130-159  mg/dL   Borderline  782-956160-189  mg/dL   High  >213>190     mg/dL   Very High   NA 086138 57/84/696210/11/2014   K 4.6 10/19/2014   CL 105 10/19/2014   CREATININE 0.93 10/19/2014   BUN 11 10/19/2014   CO2 26 10/19/2014   TSH 1.591 Test methodology is 3rd generation TSH 03/12/2009   INR 0.97 03/14/2009    BNP (last 3 results) No results for input(s): BNP in the last 8760 hours.  ProBNP (last 3 results) No results for input(s): PROBNP in the last 8760 hours.   Other Studies Reviewed Today:   Assessment/Plan: 1. HTN - BP with fair control at home. I have left him on his current regimen. Recheck BMET today. I have asked him to continue to monitor at home.   2. CAD - favor CV risk factor modification.   4. Multi substance abuse - I do not get the impression he is ready to stop yet.   Current medicines are reviewed with the patient today.  The patient does not have concerns regarding medicines other than what has been noted above.  The following changes have been made:  See above.  Labs/ tests ordered today include:    Orders Placed This Encounter    Procedures  . Basic metabolic panel     Disposition:   FU with Dr. Tenny Craw in 6 months.   Patient is agreeable to this plan and will call if any problems develop in the interim.   Signed: Rosalio Macadamia, RN, ANP-C 07/05/2015 11:12 AM  [facility] [address] [place], [place]  [address] Phone: [phone] Fax: [phone]

## 2015-07-05 NOTE — Patient Instructions (Addendum)
We will be checking the following labs today - BMET   Medication Instructions:    Continue with your current medicines.     Testing/Procedures To Be Arranged:  N/A  Follow-Up:   See Dr. Tenny Crawoss in 6 months    Other Special Instructions:   N/A    If you need a refill on your cardiac medications before your next appointment, please call your pharmacy.   Call the [facility] office at [phone] if you have any questions, problems or concerns.

## 2015-07-07 ENCOUNTER — Telehealth: Payer: Self-pay | Admitting: Nurse Practitioner

## 2015-07-07 NOTE — Telephone Encounter (Signed)
F/u  Pt returning phone call- lab results. Please call back and discuss.   

## 2016-02-22 NOTE — Progress Notes (Signed)
Cardiology Office Note   Date:  02/24/2016   ID:  French Kendra, DOB 04/05/1965, MRN [id number]  PCP:  Michiel Sites, MD  Cardiologist:   Dietrich Pates, MD   F/U of CAD and CP    History of Present Illness: Tulio Facundo is a 51 y.o. male with a history of minimal CAD by cath in 2011, tobacco abuse, EtOH, HL and CP   I saw himi n clinic in May 2017    BP was up in May  I increased toprol and added HCTZ    Since seen he denies CP  Breathing is OK He is on lamisil  Stopped EtoH  He was previously drinking 9 drinks per day       Current Meds  Medication Sig  . celecoxib (CELEBREX) 200 MG capsule Take 200 mg by mouth daily.  . cyclobenzaprine (FLEXERIL) 10 MG tablet Take 10 mg by mouth 3 (three) times daily as needed for muscle spasms.  . hydrochlorothiazide (MICROZIDE) 12.5 MG capsule Take 1 capsule (12.5 mg total) by mouth daily.  . hydrOXYzine (ATARAX/VISTARIL) 25 MG tablet Take 25 mg by mouth at bedtime.  Marland Kitchen LORazepam (ATIVAN) 1 MG tablet Take 1 mg by mouth at bedtime. For anxiety  . metoprolol succinate (TOPROL XL) 50 MG 24 hr tablet Take 1.5 tablets once a day  . pantoprazole (PROTONIX) 40 MG tablet Take 40 mg by mouth daily.  . ramipril (ALTACE) 10 MG capsule Take 10 mg by mouth daily.  Marland Kitchen terbinafine (LAMISIL) 250 MG tablet Take 250 mg by mouth daily.   . Testosterone 20.25 MG/1.25GM (1.62%) GEL Place onto the skin. 4 pumps once daily  . tretinoin (RETIN-A) 0.1 % cream Apply 1 application topically at bedtime.     Allergies:   Motrin [ibuprofen] and Tylenol [acetaminophen]   Past Medical History:  Diagnosis Date  . Alcohol abuse   . HLD (hyperlipidemia)   . Hypertension   . Ischemic heart disease   . Tobacco abuse     Past Surgical History:  Procedure Laterality Date  . CARDIAC CATHETERIZATION       Social History:  The patient  reports that he has been smoking.  He has quit using smokeless tobacco. His smokeless tobacco use included Chew. He reports that  he drinks alcohol.   Family History:  The patient's family history includes Arthritis/Rheumatoid in his mother; COPD in his mother; Congestive Heart Failure in his mother; Diabetes in his mother; Emphysema in his mother; Healthy in his brother; Hypertension in his father; Kidney disease in his father.    ROS:  Please see the history of present illness. All other systems are reviewed and  Negative to the above problem except as noted.    PHYSICAL EXAM: VS:  BP 118/78   Pulse 78   Ht 6' (1.829 m)   Wt 227 lb 9.6 oz (103.2 kg)   BMI 30.87 kg/m   120/80 L arm   130/88 R arm   GEN: obese 51 yo, in no acute distress  HEENT: normal  Neck: no JVD, carotid bruits, or masses Cardiac: RRR; no murmurs, rubs, or gallops,no edema  Respiratory:  clear to auscultation bilaterally, normal work of breathing GI: soft, nontender, nondistended, + BS  No hepatomegaly  MS: no deformity Moving all extremities   Skin: warm and dry, no rash Neuro:  Strength and sensation are intact Psych: euthymic mood, full affect   EKG:  EKG is not ordered today.   Lipid Panel  Component Value Date/Time   CHOL  03/13/2009 0230    151        ATP III CLASSIFICATION:  <200     mg/dL   Desirable  102-725200-239  mg/dL   Borderline High  >=366>=240    mg/dL   High          TRIG 440282 (H) 03/13/2009 0230   HDL 31 (L) 03/13/2009 0230   CHOLHDL 4.9 03/13/2009 0230   VLDL 56 (H) 03/13/2009 0230   LDLCALC  03/13/2009 0230    64        Total Cholesterol/HDL:CHD Risk Coronary Heart Disease Risk Table                     Men   Women  1/2 Average Risk   3.4   3.3  Average Risk       5.0   4.4  2 X Average Risk   9.6   7.1  3 X Average Risk  23.4   11.0        Use the calculated Patient Ratio above and the CHD Risk Table to determine the patient's CHD Risk.        ATP III CLASSIFICATION (LDL):  <100     mg/dL   Optimal  347-425100-129  mg/dL   Near or Above                    Optimal  130-159  mg/dL   Borderline  956-387160-189   mg/dL   High  >564>190     mg/dL   Very High      Wt Readings from Last 3 Encounters:  02/24/16 227 lb 9.6 oz (103.2 kg)  07/05/15 222 lb 1.9 oz (100.8 kg)  05/12/15 222 lb (100.7 kg)      ASSESSMENT AND PLAN:  1  HTN  BP is good  Stopping EtOH may be part of it  Keep on same meds  I will review Testosterone use and BP  2  CAD  No symptoms to sugg angna  Stay active  3  Obesity   COunselled on wt loss  Would help BP  Current medicines are reviewed at length with the patient today.  The patient does not have concerns regarding medicines.  Signed, Dietrich PatesPaula Tinna Kolker, MD  02/24/2016 3:54 PM    [facility] [address], [place], [place]  [address] Phone: [phone](336) [phone]; Fax: [phone]

## 2016-02-24 ENCOUNTER — Ambulatory Visit (INDEPENDENT_AMBULATORY_CARE_PROVIDER_SITE_OTHER): Payer: Managed Care, Other (non HMO) | Admitting: Internal Medicine

## 2016-02-24 ENCOUNTER — Encounter: Payer: Self-pay | Admitting: Internal Medicine

## 2016-02-24 ENCOUNTER — Encounter (INDEPENDENT_AMBULATORY_CARE_PROVIDER_SITE_OTHER): Payer: Self-pay

## 2016-02-24 VITALS — BP 118/78 | HR 78 | Ht 72.0 in | Wt 227.6 lb

## 2016-02-24 DIAGNOSIS — E785 Hyperlipidemia, unspecified: Secondary | ICD-10-CM | POA: Diagnosis not present

## 2016-02-24 DIAGNOSIS — I10 Essential (primary) hypertension: Secondary | ICD-10-CM

## 2016-02-24 NOTE — Patient Instructions (Signed)
Your physician recommends that you continue on your current medications as directed. Please refer to the Current Medication list given to you today. Your physician wants you to follow-up in: 9 months with Dr. Ross.  You will receive a reminder letter in the mail two months in advance. If you don't receive a letter, please call our office to schedule the follow-up appointment.  

## 2016-04-05 ENCOUNTER — Other Ambulatory Visit: Payer: Self-pay | Admitting: Neurosurgery

## 2016-04-05 DIAGNOSIS — M5414 Radiculopathy, thoracic region: Secondary | ICD-10-CM

## 2016-04-05 DIAGNOSIS — M5416 Radiculopathy, lumbar region: Secondary | ICD-10-CM

## 2016-04-18 ENCOUNTER — Telehealth: Payer: Self-pay | Admitting: Internal Medicine

## 2016-04-18 DIAGNOSIS — E782 Mixed hyperlipidemia: Secondary | ICD-10-CM

## 2016-04-18 NOTE — Telephone Encounter (Signed)
Received lipids from Dr Juleen China.  LDL was 135  HDL 35   Cath in 2011 showed very mild plaquing   He was on Simcor in the past I would recomm Crestor 20 mg  F?U lipids in 8 wks  Goal close to 70  Also-- spoke with pharmacy  Testosterone has the possibilty of raising BP some  His BP was good but he was on several meds I have forwarded to Dr Juleen China to review  No new recommendations.

## 2016-04-19 ENCOUNTER — Ambulatory Visit
Admission: RE | Admit: 2016-04-19 | Discharge: 2016-04-19 | Disposition: A | Discharge status: Home / Self Care | Payer: Managed Care, Other (non HMO) | Source: Ambulatory Visit | Attending: Neurosurgery | Admitting: Neurosurgery

## 2016-04-19 DIAGNOSIS — M5416 Radiculopathy, lumbar region: Secondary | ICD-10-CM

## 2016-04-19 DIAGNOSIS — M5414 Radiculopathy, thoracic region: Secondary | ICD-10-CM

## 2016-04-19 NOTE — Telephone Encounter (Signed)
Left message for patient to call back  

## 2016-04-20 MED ORDER — ROSUVASTATIN CALCIUM 20 MG PO TABS: 20.0000 mg | ORAL_TABLET | Freq: Every day | ORAL | 3 refills | Status: DC

## 2016-04-20 NOTE — Telephone Encounter (Signed)
Spoke with patient.  He will pick up Crestor and start it.  Will call back to schedule lipids.  Advised of goal of 70.  Also advised note was forwarded to Dr. Juleen China re: testosterone and BP.

## 2016-04-20 NOTE — Telephone Encounter (Signed)
Follow up ° ° ° °Pt is returning call to RN about labs.  °

## 2016-04-20 NOTE — Telephone Encounter (Signed)
Follow up    Patient asking nurse to call after 2:30 pm

## 2016-06-18 ENCOUNTER — Other Ambulatory Visit: Payer: Managed Care, Other (non HMO)

## 2017-02-12 ENCOUNTER — Encounter: Payer: Self-pay | Admitting: Hematology and Oncology

## 2017-02-20 ENCOUNTER — Other Ambulatory Visit: Payer: Self-pay

## 2017-02-21 ENCOUNTER — Telehealth: Payer: Self-pay

## 2017-02-21 ENCOUNTER — Other Ambulatory Visit: Payer: Self-pay | Admitting: Gastroenterology

## 2017-02-21 ENCOUNTER — Inpatient Hospital Stay: Payer: BLUE CROSS/BLUE SHIELD

## 2017-02-21 ENCOUNTER — Telehealth: Payer: Self-pay | Admitting: Hematology and Oncology

## 2017-02-21 ENCOUNTER — Inpatient Hospital Stay: Payer: BLUE CROSS/BLUE SHIELD | Attending: Hematology and Oncology | Admitting: Hematology and Oncology

## 2017-02-21 ENCOUNTER — Encounter: Payer: Self-pay | Admitting: Hematology and Oncology

## 2017-02-21 VITALS — BP 156/92 | HR 80 | Temp 98.6°F | Resp 20 | Ht 72.0 in | Wt 229.4 lb

## 2017-02-21 DIAGNOSIS — I2510 Atherosclerotic heart disease of native coronary artery without angina pectoris: Secondary | ICD-10-CM | POA: Insufficient documentation

## 2017-02-21 DIAGNOSIS — D751 Secondary polycythemia: Secondary | ICD-10-CM

## 2017-02-21 DIAGNOSIS — R718 Other abnormality of red blood cells: Secondary | ICD-10-CM | POA: Diagnosis not present

## 2017-02-21 DIAGNOSIS — R945 Abnormal results of liver function studies: Secondary | ICD-10-CM

## 2017-02-21 DIAGNOSIS — Z720 Tobacco use: Secondary | ICD-10-CM | POA: Insufficient documentation

## 2017-02-21 DIAGNOSIS — R7989 Other specified abnormal findings of blood chemistry: Secondary | ICD-10-CM

## 2017-02-21 DIAGNOSIS — F1010 Alcohol abuse, uncomplicated: Secondary | ICD-10-CM | POA: Diagnosis not present

## 2017-02-21 LAB — CBC WITH DIFFERENTIAL (CANCER CENTER ONLY)
BASOS ABS: 0 10*3/uL (ref 0.0–0.1)
BASOS PCT: 1 %
Eosinophils Absolute: 0.2 10*3/uL (ref 0.0–0.5)
Eosinophils Relative: 4 %
HEMATOCRIT: 48.9 % (ref 38.4–49.9)
HEMOGLOBIN: 16.5 g/dL (ref 13.0–17.1)
Lymphocytes Relative: 37 %
Lymphs Abs: 2.4 10*3/uL (ref 0.9–3.3)
MCH: 31.3 pg (ref 27.2–33.4)
MCHC: 33.7 g/dL (ref 32.0–36.0)
MCV: 92.6 fL (ref 79.3–98.0)
MONOS PCT: 9 %
Monocytes Absolute: 0.6 10*3/uL (ref 0.1–0.9)
NEUTROS ABS: 3.3 10*3/uL (ref 1.5–6.5)
NEUTROS PCT: 49 %
Platelet Count: 153 10*3/uL (ref 140–400)
RBC: 5.28 MIL/uL (ref 4.20–5.82)
RDW: 12.7 % (ref 11.0–14.6)
WBC: 6.5 10*3/uL (ref 4.0–10.3)

## 2017-02-21 LAB — CMP (CANCER CENTER ONLY)
ALK PHOS: 162 U/L — AB (ref 40–150)
ALT: 53 U/L (ref 0–55)
ANION GAP: 9 (ref 3–11)
AST: 28 U/L (ref 5–34)
Albumin: 3.8 g/dL (ref 3.5–5.0)
BUN: 14 mg/dL (ref 7–26)
CALCIUM: 9.3 mg/dL (ref 8.4–10.4)
CO2: 28 mmol/L (ref 22–29)
Chloride: 105 mmol/L (ref 98–109)
Creatinine: 1.01 mg/dL (ref 0.70–1.30)
Glucose, Bld: 101 mg/dL (ref 70–140)
Potassium: 4.5 mmol/L (ref 3.5–5.1)
SODIUM: 142 mmol/L (ref 136–145)
TOTAL PROTEIN: 6.7 g/dL (ref 6.4–8.3)
Total Bilirubin: 0.9 mg/dL (ref 0.2–1.2)

## 2017-02-21 LAB — LACTATE DEHYDROGENASE: LDH: 168 U/L (ref 125–245)

## 2017-02-21 NOTE — Telephone Encounter (Signed)
Patient left a voice mail wanting to reschedule for 3/7 early

## 2017-02-21 NOTE — Telephone Encounter (Signed)
Printed avs and calender for upcoming appointment. Per 2/14 los 

## 2017-02-21 NOTE — Telephone Encounter (Signed)
Printed avs and calender of upcoming appointment. Per 2/14 los 

## 2017-02-22 LAB — ERYTHROPOIETIN: Erythropoietin: 13.2 m[IU]/mL (ref 2.6–18.5)

## 2017-03-01 ENCOUNTER — Ambulatory Visit
Admission: RE | Admit: 2017-03-01 | Discharge: 2017-03-01 | Disposition: A | Discharge status: Home / Self Care | Payer: BLUE CROSS/BLUE SHIELD | Source: Ambulatory Visit | Attending: Gastroenterology | Admitting: Gastroenterology

## 2017-03-01 DIAGNOSIS — R7989 Other specified abnormal findings of blood chemistry: Secondary | ICD-10-CM

## 2017-03-01 DIAGNOSIS — R945 Abnormal results of liver function studies: Secondary | ICD-10-CM

## 2017-03-08 ENCOUNTER — Ambulatory Visit: Payer: BLUE CROSS/BLUE SHIELD | Admitting: Hematology and Oncology

## 2017-03-13 ENCOUNTER — Ambulatory Visit
Admission: RE | Admit: 2017-03-13 | Discharge: 2017-03-13 | Disposition: A | Discharge status: Home / Self Care | Payer: BLUE CROSS/BLUE SHIELD | Source: Ambulatory Visit | Attending: Gastroenterology | Admitting: Gastroenterology

## 2017-03-13 NOTE — Progress Notes (Signed)
[facility] New Visit:  Assessment: Erythrocytosis 52 y.o. male with progressive elevation of hemoglobin count and hematocrit with stable and normal white blood cell count and platelets in the setting of significant tobacco smoking history.  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  Plan: -Labs today as outlined below. -Return to clinic in 2 weeks to review the findings.   Voice recognition software was used and creation of this note. Despite my best effort at editing the text, some misspelling/errors may have occurred. Orders Placed This Encounter  Procedures  . CBC with Differential (Cancer Center Only)    Standing Status:   Future    Number of Occurrences:   1    Standing Expiration Date:   02/21/2018  . CMP ([facility] only)    Standing Status:   Future    Number of Occurrences:   1    Standing Expiration Date:   02/21/2018  . Lactate dehydrogenase (LDH)    Standing Status:   Future    Number of Occurrences:   1    Standing Expiration Date:   02/21/2018  . Erythropoietin    Standing Status:   Future    Number of Occurrences:   1    Standing Expiration Date:   02/21/2018    All questions were answered.  . The patient knows to call the clinic with any problems, questions or concerns.  This note was electronically signed.    History of Presenting Illness Corey Archer 52 y.o. 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 Deland Pretty.  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  Patient is using AndroGel for the past 10 years.  Currently, patient smokes approximately 0.5 packs/day for the past 2 years, but prior to that smoked  1 pack/day for about 25 years.  His alcohol  consumption is approximately 9 alcoholic beverages per day.  Family history significant for mother with rheumatoid arthritis, CHF, and COPD, but no family history of primary hematological disorders.  The present time, patient denies fevers, chills, night sweats.  Denies any unexpected weight loss.  No headaches, vision changes.  She does acknowledge flushed face with intermittent feeling of flushing and heat in his face.  No significant redness or discomfort in the palms of his hands.  Patient denies nausea, vomiting, early satiety, abdominal pain, diarrhea, or constipation.  Oncological/hematological History: **Polycythemia: --Labs, 01/20/16: WBC 7.3, Hgb 17.1, Hct 49.2, Plt 132; --Labs, 01/31/17: WBC 7.1, Hgb 18.8, Hct 55.3, Plt 156;  Medical History: Past Medical History:  Diagnosis Date  . Alcohol abuse   . HLD (hyperlipidemia)   . Hypertension   . Ischemic heart disease   . Tobacco abuse     Surgical History: Past Surgical History:  Procedure Laterality Date  . CARDIAC CATHETERIZATION      Family History: Family History  Problem Relation Age of Onset  . Congestive Heart Failure Mother   . COPD Mother   . Diabetes Mother   . Emphysema Mother   . Arthritis/Rheumatoid Mother   . Kidney disease Father   . Hypertension Father   . Healthy Brother     Social History: Social History   Socioeconomic History  . Marital status: Married    Spouse name: Not on  file  . Number of children: Not on file  . Years of education: Not on file  . Highest education level: Not on file  Social Needs  . Financial resource strain: Not on file  . Food insecurity - worry: Not on file  . Food insecurity - inability: Not on file  . Transportation needs - medical: Not on file  . Transportation needs - non-medical: Not on file  Occupational History  . Not on file  Tobacco Use  . Smoking status: Current Every Day Smoker  . Smokeless tobacco:  Former Systems developer    Types: Chew  Substance and Sexual Activity  . Alcohol use: Yes    Alcohol/week: 0.0 oz  . Drug use: Not on file  . Sexual activity: Not on file  Other Topics Concern  . Not on file  Social History Narrative  . Not on file    Allergies: Allergies  Allergen Reactions  . Motrin [Ibuprofen]   . Tylenol [Acetaminophen] Other (See Comments)    Ulcers     Medications:  Current Outpatient Medications  Medication Sig Dispense Refill  . celecoxib (CELEBREX) 200 MG capsule Take 200 mg by mouth daily.    . cyclobenzaprine (FLEXERIL) 10 MG tablet Take 10 mg by mouth 3 (three) times daily as needed for muscle spasms.    . hydrochlorothiazide (MICROZIDE) 12.5 MG capsule Take 1 capsule (12.5 mg total) by mouth daily. 30 capsule 1  . hydrOXYzine (ATARAX/VISTARIL) 25 MG tablet Take 25 mg by mouth at bedtime.    Marland Kitchen LORazepam (ATIVAN) 1 MG tablet Take 1 mg by mouth at bedtime. For anxiety    . metoprolol succinate (TOPROL XL) 50 MG 24 hr tablet Take 1.5 tablets once a day 45 tablet 6  . pantoprazole (PROTONIX) 40 MG tablet Take 40 mg by mouth daily.    . ramipril (ALTACE) 10 MG capsule Take 10 mg by mouth daily.    . sildenafil (REVATIO) 20 MG tablet Take 20 mg by mouth 3 (three) times daily.    . Testosterone 20.25 MG/1.25GM (1.62%) GEL Place onto the skin. 4 pumps once daily    . tretinoin (RETIN-A) 0.1 % cream Apply 1 application topically at bedtime.    . rosuvastatin (CRESTOR) 20 MG tablet Take 1 tablet (20 mg total) by mouth daily. 90 tablet 3  . terbinafine (LAMISIL) 250 MG tablet Take 250 mg by mouth daily.      No current facility-administered medications for this visit.     Review of Systems: Review of Systems  All other systems reviewed and are negative.    PHYSICAL EXAMINATION Blood pressure (!) 156/92, pulse 80, temperature 98.6 F (37 C), temperature source Oral, resp. rate 20, height 6' (1.829 m), weight 229 lb 6.4 oz (104.1 kg), SpO2 98 %.  ECOG  PERFORMANCE STATUS: 1 - Symptomatic but completely ambulatory  Physical Exam  Constitutional: He is oriented to person, place, and time and well-developed, well-nourished, and in no distress. No distress.  HENT:  Head: Normocephalic and atraumatic.  Mouth/Throat: Oropharynx is clear and moist. No oropharyngeal exudate.  Face appears flushed.  Neck: No thyromegaly present.  Cardiovascular: Normal rate, regular rhythm, normal heart sounds and intact distal pulses.  No murmur heard. Pulmonary/Chest: Effort normal and breath sounds normal. No respiratory distress. He has no wheezes. He has no rales.  Abdominal: Soft. Bowel sounds are normal. He exhibits no distension and no mass. There is no tenderness. There is no guarding.  Musculoskeletal: He exhibits no edema.  Lymphadenopathy:    He has no cervical adenopathy.  Neurological: He is alert and oriented to person, place, and time. He has normal reflexes. No cranial nerve deficit.  Skin: Skin is warm and dry. No rash noted. He is not diaphoretic. No erythema.  Possible livedo reticularis on bilateral upper extremities.     LABORATORY DATA: I have personally reviewed the data as listed: Appointment on 02/21/2017  Component Date Value Ref Range Status  . Erythropoietin 02/21/2017 13.2  2.6 - 18.5 mIU/mL Final   Comment: (NOTE) Beckman Coulter UniCel DxI [place] obtained with different assay methods or kits cannot be used interchangeably. Results cannot be interpreted as absolute evidence of the presence or absence of malignant disease. Performed At: [facility] [place], [place] [id number] Rush Farmer MD [id number] Performed at [facility], [place] [address] [place], [place] [address]   . LDH 02/21/2017 168  125 - 245 U/L Final   Performed at [facility], [place] [address], [place], [place] [address]  . Sodium 02/21/2017 142  136 - 145  mmol/L Final  . Potassium 02/21/2017 4.5  3.5 - 5.1 mmol/L Final  . Chloride 02/21/2017 105  98 - 109 mmol/L Final  . CO2 02/21/2017 28  22 - 29 mmol/L Final  . Glucose, Bld 02/21/2017 101  70 - 140 mg/dL Final  . BUN 02/21/2017 14  7 - 26 mg/dL Final  . Creatinine 02/21/2017 1.01  0.70 - 1.30 mg/dL Final  . Calcium 02/21/2017 9.3  8.4 - 10.4 mg/dL Final  . Total Protein 02/21/2017 6.7  6.4 - 8.3 g/dL Final  . Albumin 02/21/2017 3.8  3.5 - 5.0 g/dL Final  . AST 02/21/2017 28  5 - 34 U/L Final  . ALT 02/21/2017 53  0 - 55 U/L Final  . Alkaline Phosphatase 02/21/2017 162* 40 - 150 U/L Final  . Total Bilirubin 02/21/2017 0.9  0.2 - 1.2 mg/dL Final  . GFR, Est Non Af Am 02/21/2017 >60  >60 mL/min Final  . GFR, Est AFR Am 02/21/2017 >60  >60 mL/min Final   Comment: (NOTE) The eGFR has been calculated using the CKD EPI equation. This calculation has not been validated in all clinical situations. eGFR's persistently <60 mL/min signify possible Chronic Kidney Disease.   Georgiann Hahn gap 02/21/2017 9  3 - 11 Final   Performed at [facility], [place] [address] [place], [place] [address]  . WBC Count 02/21/2017 6.5  4.0 - 10.3 K/uL Final  . RBC 02/21/2017 5.28  4.20 - 5.82 MIL/uL Final  . Hemoglobin 02/21/2017 16.5  13.0 - 17.1 g/dL Final  . HCT 02/21/2017 48.9  38.4 - 49.9 % Final  . MCV 02/21/2017 92.6  79.3 - 98.0 fL Final  . MCH 02/21/2017 31.3  27.2 - 33.4 pg Final  . MCHC 02/21/2017 33.7  32.0 - 36.0 g/dL Final  . RDW 02/21/2017 12.7  11.0 - 14.6 % Final  . Platelet Count 02/21/2017 153  140 - 400 K/uL Final  . Neutrophils Relative % 02/21/2017 49  % Final  . Neutro Abs 02/21/2017 3.3  1.5 - 6.5 K/uL Final  . Lymphocytes Relative 02/21/2017 37  % Final  . Lymphs Abs 02/21/2017 2.4  0.9 - 3.3 K/uL Final  . Monocytes Relative 02/21/2017 9  % Final  . Monocytes Absolute 02/21/2017 0.6  0.1 - 0.9 K/uL Final  . Eosinophils Relative 02/21/2017 4  %  Final  . Eosinophils  Absolute 02/21/2017 0.2  0.0 - 0.5 K/uL Final  . Basophils Relative 02/21/2017 1  % Final  . Basophils Absolute 02/21/2017 0.0  0.0 - 0.1 K/uL Final   Performed at [facility], [place] [address], [place], [place] [address]         Ardath Sax, MD

## 2017-03-13 NOTE — Assessment & Plan Note (Signed)
52 y.o. male with progressive elevation of hemoglobin count and hematocrit with stable and normal white blood cell count and platelets in the setting of significant tobacco smoking history.  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  Plan: -Labs today as outlined below. -Return to clinic in 2 weeks to review the findings.

## 2017-03-14 ENCOUNTER — Telehealth: Payer: Self-pay | Admitting: Hematology and Oncology

## 2017-03-14 ENCOUNTER — Inpatient Hospital Stay: Payer: BLUE CROSS/BLUE SHIELD | Attending: Hematology and Oncology | Admitting: Hematology and Oncology

## 2017-03-14 ENCOUNTER — Encounter: Payer: Self-pay | Admitting: Hematology and Oncology

## 2017-03-14 VITALS — BP 145/86 | HR 89 | Temp 98.9°F | Resp 18 | Ht 72.0 in | Wt 226.4 lb

## 2017-03-14 DIAGNOSIS — Z720 Tobacco use: Secondary | ICD-10-CM

## 2017-03-14 DIAGNOSIS — D751 Secondary polycythemia: Secondary | ICD-10-CM | POA: Diagnosis not present

## 2017-03-14 DIAGNOSIS — R718 Other abnormality of red blood cells: Secondary | ICD-10-CM | POA: Diagnosis present

## 2017-03-14 NOTE — Telephone Encounter (Signed)
Per 3/7 los return if symptoms worsen

## 2017-03-24 NOTE — Progress Notes (Signed)
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  Plan: -Recommend repeat sleep study to assess for possible contribution of sleep apnea -Recommend follow-up by primary care provider with CBC measures at least twice a year and referral back to hematology for possible phlebotomy if hematocrit exceeds 55% -At this time, return to our clinic as needed.     Voice recognition software was used and creation of this note. Despite my best effort at editing the text, some misspelling/errors may have occurred.  No orders of the defined types were placed in this encounter.   Cancer Staging No matching staging information was found for the patient.  All questions were answered.  . The patient knows to call the clinic with any problems, questions or concerns.  This note was electronically signed.    History of Presenting Illness Corey Archer is a 52 y.o. 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 Deland Pretty.  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  Patient is using AndroGel for the past 10 years.  Currently, patient smokes approximately 0.5 packs/day for the past 2 years, but prior to that smoked 1 pack/day for about 25 years.  His alcohol  consumption is approximately 9 alcoholic beverages per day.  Family history significant for mother with rheumatoid arthritis, CHF, and COPD, but no family history of primary hematological  disorders.  The present time, patient denies fevers, chills, night sweats.  Denies any unexpected weight loss.  No headaches, vision changes.  She does acknowledge flushed face with intermittent feeling of flushing and heat in his face.  No significant redness or discomfort in the palms of his hands.  Patient denies nausea, vomiting, early satiety, abdominal pain, diarrhea, or constipation.  Oncological/hematological History: **Secondary erythrocytosis: --Labs, 01/20/16: WBC 7.3, Hgb 17.1, Hct 49.2, Plt 132; --Labs, 01/31/17: WBC 7.1, Hgb 18.8, Hct 55.3, Plt 156; --Labs, 02/21/17: WBC 6.5, Hgb 16.5, Hct 48.9, Plt 153; LDH 168, Epo 13.2 --US Abdomen, 03/23/17: Increased echotexture of the liver secondary to fatty infiltration of the liver.  No focal liver masses.   No history exists.    Medical History: Past Medical History:  Diagnosis Date  . Alcohol abuse   . HLD (hyperlipidemia)   . Hypertension   . Ischemic heart disease   . Tobacco abuse     Surgical History: Past Surgical History:  Procedure Laterality Date  . CARDIAC CATHETERIZATION      Family History: Family History  Problem Relation Age of Onset  . Congestive Heart Failure Mother   . COPD Mother   . Diabetes Mother   . Emphysema Mother   . Arthritis/Rheumatoid Mother   . Kidney disease Father   . Hypertension Father   . Healthy Brother     Social History: Social History   Socioeconomic History  . Marital status: Married    Spouse name: Not on file  . Number of children: Not on file  . Years of education: Not on file  . Highest education level: Not on file  Social Needs  . Financial resource strain:  Not on file  . Food insecurity - worry: Not on file  . Food insecurity - inability: Not on file  . Transportation needs - medical: Not on file  . Transportation needs - non-medical: Not on file  Occupational History  . Not on file  Tobacco Use  . Smoking status: Current Every Day Smoker  . Smokeless  tobacco: Former Systems developer    Types: Chew  Substance and Sexual Activity  . Alcohol use: Yes    Alcohol/week: 0.0 oz  . Drug use: Not on file  . Sexual activity: Not on file  Other Topics Concern  . Not on file  Social History Narrative  . Not on file    Allergies: Allergies  Allergen Reactions  . Motrin [Ibuprofen]   . Tylenol [Acetaminophen] Other (See Comments)    Ulcers     Medications:  Current Outpatient Medications  Medication Sig Dispense Refill  . celecoxib (CELEBREX) 200 MG capsule Take 200 mg by mouth daily.    . cyclobenzaprine (FLEXERIL) 10 MG tablet Take 10 mg by mouth 3 (three) times daily as needed for muscle spasms.    . hydrochlorothiazide (MICROZIDE) 12.5 MG capsule Take 1 capsule (12.5 mg total) by mouth daily. 30 capsule 1  . hydrOXYzine (ATARAX/VISTARIL) 25 MG tablet Take 25 mg by mouth at bedtime.    Marland Kitchen LORazepam (ATIVAN) 1 MG tablet Take 1 mg by mouth at bedtime. For anxiety    . metoprolol succinate (TOPROL XL) 50 MG 24 hr tablet Take 1.5 tablets once a day 45 tablet 6  . pantoprazole (PROTONIX) 40 MG tablet Take 40 mg by mouth daily.    . ramipril (ALTACE) 10 MG capsule Take 10 mg by mouth daily.    . sildenafil (REVATIO) 20 MG tablet Take 20 mg by mouth 3 (three) times daily.    . Testosterone 20.25 MG/1.25GM (1.62%) GEL Place onto the skin. 4 pumps once daily    . tretinoin (RETIN-A) 0.1 % cream Apply 1 application topically at bedtime.    . rosuvastatin (CRESTOR) 20 MG tablet Take 1 tablet (20 mg total) by mouth daily. 90 tablet 3   No current facility-administered medications for this visit.     Review of Systems: Review of Systems  All other systems reviewed and are negative.    PHYSICAL EXAMINATION Blood pressure (!) 145/86, pulse 89, temperature 98.9 F (37.2 C), temperature source Oral, resp. rate 18, height 6' (1.829 m), weight 226 lb 6.4 oz (102.7 kg), SpO2 100 %.  ECOG PERFORMANCE STATUS: 1 - Symptomatic but completely  ambulatory  Physical Exam  Constitutional: He is oriented to person, place, and time and well-developed, well-nourished, and in no distress. No distress.  HENT:  Head: Normocephalic and atraumatic.  Mouth/Throat: Oropharynx is clear and moist. No oropharyngeal exudate.  Face appears flushed.  Neck: No thyromegaly present.  Cardiovascular: Normal rate, regular rhythm, normal heart sounds and intact distal pulses.  No murmur heard. Pulmonary/Chest: Effort normal and breath sounds normal. No respiratory distress. He has no wheezes. He has no rales.  Abdominal: Soft. Bowel sounds are normal. He exhibits no distension and no mass. There is no tenderness. There is no guarding.  Musculoskeletal: He exhibits no edema.  Lymphadenopathy:    He has no cervical adenopathy.  Neurological: He is alert and oriented to person, place, and time. He has normal reflexes. No cranial nerve deficit.  Skin: Skin is warm and dry. No rash noted. He is not diaphoretic. No erythema.  Possible livedo  reticularis on bilateral upper extremities.     LABORATORY DATA: I have personally reviewed the data as listed: No visits with results within 1 Week(s) from this visit.  Latest known visit with results is:  Appointment on 02/21/2017  Component Date Value Ref Range Status  . Erythropoietin 02/21/2017 13.2  2.6 - 18.5 mIU/mL Final   Comment: (NOTE) Beckman Coulter UniCel DxI [place] obtained with different assay methods or kits cannot be used interchangeably. Results cannot be interpreted as absolute evidence of the presence or absence of malignant disease. Performed At: [facility] [place], [place] [id number] Rush Farmer MD [id number] Performed at [facility], [place] [address] [place], [place] [address]   . LDH 02/21/2017 168  125 - 245 U/L Final   Performed at [facility], [place] [address], [place],  [place] [address]  . Sodium 02/21/2017 142  136 - 145 mmol/L Final  . Potassium 02/21/2017 4.5  3.5 - 5.1 mmol/L Final  . Chloride 02/21/2017 105  98 - 109 mmol/L Final  . CO2 02/21/2017 28  22 - 29 mmol/L Final  . Glucose, Bld 02/21/2017 101  70 - 140 mg/dL Final  . BUN 02/21/2017 14  7 - 26 mg/dL Final  . Creatinine 02/21/2017 1.01  0.70 - 1.30 mg/dL Final  . Calcium 02/21/2017 9.3  8.4 - 10.4 mg/dL Final  . Total Protein 02/21/2017 6.7  6.4 - 8.3 g/dL Final  . Albumin 02/21/2017 3.8  3.5 - 5.0 g/dL Final  . AST 02/21/2017 28  5 - 34 U/L Final  . ALT 02/21/2017 53  0 - 55 U/L Final  . Alkaline Phosphatase 02/21/2017 162* 40 - 150 U/L Final  . Total Bilirubin 02/21/2017 0.9  0.2 - 1.2 mg/dL Final  . GFR, Est Non Af Am 02/21/2017 >60  >60 mL/min Final  . GFR, Est AFR Am 02/21/2017 >60  >60 mL/min Final   Comment: (NOTE) The eGFR has been calculated using the CKD EPI equation. This calculation has not been validated in all clinical situations. eGFR's persistently <60 mL/min signify possible Chronic Kidney Disease.   Georgiann Hahn gap 02/21/2017 9  3 - 11 Final   Performed at [facility], [place] [address], [place], [place] [address]  . WBC Count 02/21/2017 6.5  4.0 - 10.3 K/uL Final  . RBC 02/21/2017 5.28  4.20 - 5.82 MIL/uL Final  . Hemoglobin 02/21/2017 16.5  13.0 - 17.1 g/dL Final  . HCT 02/21/2017 48.9  38.4 - 49.9 % Final  . MCV 02/21/2017 92.6  79.3 - 98.0 fL Final  . MCH 02/21/2017 31.3  27.2 - 33.4 pg Final  . MCHC 02/21/2017 33.7  32.0 - 36.0 g/dL Final  . RDW 02/21/2017 12.7  11.0 - 14.6 % Final  . Platelet Count 02/21/2017 153  140 - 400 K/uL Final  . Neutrophils Relative % 02/21/2017 49  % Final  . Neutro Abs 02/21/2017 3.3  1.5 - 6.5 K/uL Final  . Lymphocytes Relative 02/21/2017 37  % Final  . Lymphs Abs 02/21/2017 2.4  0.9 - 3.3 K/uL Final  . Monocytes Relative 02/21/2017 9  % Final  . Monocytes Absolute 02/21/2017 0.6  0.1 - 0.9 K/uL Final  . Eosinophils  Relative 02/21/2017 4  % Final  . Eosinophils Absolute 02/21/2017 0.2  0.0 - 0.5 K/uL Final  . Basophils Relative 02/21/2017 1  % Final  . Basophils Absolute 02/21/2017 0.0  0.0 - 0.1 K/uL Final   Performed at [facility], [place] [address] [place], [place] [address]       Ardath Sax, MD

## 2017-03-24 NOTE — Assessment & Plan Note (Signed)
52 y.o. male with progressive elevation of hemoglobin count and hematocrit with stable and normal white blood cell count and platelets in the setting of significant tobacco smoking history.  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  Plan: -Recommend repeat sleep study to assess for possible contribution of sleep apnea -Recommend follow-up by primary care provider with CBC measures at least twice a year and referral back to hematology for possible phlebotomy if hematocrit exceeds 55% -At this time, return to our clinic as needed.

## 2017-04-17 ENCOUNTER — Institutional Professional Consult (permissible substitution): Payer: BLUE CROSS/BLUE SHIELD | Admitting: Pulmonary Disease

## 2017-04-25 ENCOUNTER — Institutional Professional Consult (permissible substitution): Payer: BLUE CROSS/BLUE SHIELD | Admitting: Internal Medicine

## 2017-05-24 ENCOUNTER — Encounter: Payer: Self-pay | Admitting: Pulmonary Disease

## 2017-05-24 ENCOUNTER — Ambulatory Visit (INDEPENDENT_AMBULATORY_CARE_PROVIDER_SITE_OTHER): Payer: BLUE CROSS/BLUE SHIELD | Admitting: Pulmonary Disease

## 2017-05-24 ENCOUNTER — Ambulatory Visit (INDEPENDENT_AMBULATORY_CARE_PROVIDER_SITE_OTHER)
Admission: RE | Admit: 2017-05-24 | Discharge: 2017-05-24 | Disposition: A | Discharge status: Home / Self Care | Payer: BLUE CROSS/BLUE SHIELD | Source: Ambulatory Visit | Attending: Pulmonary Disease | Admitting: Pulmonary Disease

## 2017-05-24 VITALS — BP 148/80 | HR 85 | Ht 72.0 in | Wt 224.4 lb

## 2017-05-24 DIAGNOSIS — G4733 Obstructive sleep apnea (adult) (pediatric): Secondary | ICD-10-CM

## 2017-05-24 DIAGNOSIS — J441 Chronic obstructive pulmonary disease with (acute) exacerbation: Secondary | ICD-10-CM | POA: Diagnosis not present

## 2017-05-24 DIAGNOSIS — D751 Secondary polycythemia: Secondary | ICD-10-CM | POA: Diagnosis not present

## 2017-05-24 NOTE — Assessment & Plan Note (Addendum)
Very likely related to smoking, also possibly sleep disordered breathing Surprisingly lung function is normal today. Smoking cessation was strongly emphasized. We will obtain a chest x-ray for completion Since he did not have a qualifying pulmonary condition, I am unable to give him a letter for work

## 2017-05-24 NOTE — Patient Instructions (Signed)
Your high red cell count may be related to smoking or to sleep apnea.  Chest x-ray today.  Schedule home sleep study

## 2017-05-24 NOTE — Assessment & Plan Note (Signed)
Given excessive daytime somnolence, narrow pharyngeal exam, witnessed apneas & loud snoring, obstructive sleep apnea is very likely & an overnight polysomnogram will be scheduled as a home study. The pathophysiology of obstructive sleep apnea , it's cardiovascular consequences & modes of treatment including CPAP were discused with the patient in detail & they evidenced understanding.  Pretest probability is intermediate  

## 2017-05-24 NOTE — Progress Notes (Signed)
Subjective:    Patient ID: Corey Archer, male    DOB: Oct 20, 1965, 52 y.o.   MRN: [id number]  HPI  Chief Complaint  Patient presents with  . pulmonary consult    high Hgb, was referred to pulmonolgy-not sleeping well at night referred by Renne Crigler    52 year old smoker referred for evaluation of sleep disordered breathing.  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  Hemoglobin levels have varied from 17.1, 18.8 and most recently 16.5 and 02/2017 on his last check. He smokes about half pack per day now, was smoking as high as a pack per day, more than 30 pack years.  He also reports having a chest cold every year for the last 3 years and wonders if he has chronic bronchitis.  He wonders if he can also have a letter for work.  He works in a distribution center in the receiving area and on certain days he is asked to work in [employer] and he feels that the cold temperature makes his breathing worse.  He wonders if he can have a letter stating that he is medically unfit for this type of work for an indefinite..  Apparently other employees have obtained such letters and been able to avoid working in the trailer.  Epworth sleepiness score is 2 and he denies excessive daytime somnolence or fatigue. Bedtime is around 8:30 PM, loud snoring has been noted by his wife who now sleeps in a different room.  He says this is because of her snoring.  Sleep latency is a few minutes, sleeps on his side with 2 pillows, reports numerous nocturnal awakenings after the first 2 hours of some sleep up to 20 including at least 2 bathroom breaks and is out of bed by 4:30 AM feeling tired without dryness of mouth or headaches.  His weight has remained constant may be gained 5 pounds over the last 2 years. 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 Alcohol abuse   . HLD (hyperlipidemia)   . Hypertension   . Ischemic heart disease   . Tobacco abuse    Past Surgical History:  Procedure Laterality Date  . CARDIAC CATHETERIZATION       Allergies  Allergen Reactions  . Motrin [Ibuprofen]   . Tylenol [Acetaminophen] Other (See Comments)    Ulcers      Social History   Socioeconomic History  . Marital status: Married    Spouse name: Not on file  . Number of children: Not on file  . Years of education: Not on file  . Highest education level: Not on file  Occupational History  . Not on file  Social Needs  . Financial resource strain: Not on file  . Food insecurity:    Worry: Not on file    Inability: Not on file  . Transportation needs:    Medical: Not on file    Non-medical: Not on file  Tobacco Use  .  Smoking status: Current Every Day Smoker    Packs/day: 0.50    Years: 35.00    Pack years: 17.50    Types: Cigarettes  . Smokeless tobacco: Former Neurosurgeon    Types: Chew    Quit date: 05/24/1993  Substance and Sexual Activity  . Alcohol use: Yes    Alcohol/week: 0.0 oz  . Drug use: Not on file  . Sexual activity: Not on file  Lifestyle  . Physical activity:    Days per week: Not on file    Minutes per session: Not on file  . Stress: Not on file  Relationships  . Social connections:    Talks on phone: Not on file    Gets together: Not on file    Attends religious service: Not on file    Active member of club or organization: Not on file    Attends meetings of clubs or organizations: Not on file    Relationship status: Not on file  . Intimate partner violence:      Fear of current or ex partner: Not on file    Emotionally abused: Not on file    Physically abused: Not on file    Forced sexual activity: Not on file  Other Topics Concern  . Not on file  Social History Narrative  . Not on file    Family History  Problem Relation Age of Onset  . Congestive Heart Failure Mother   . COPD Mother   . Diabetes Mother   . Emphysema Mother   . Arthritis/Rheumatoid Mother   . Kidney disease Father   . Hypertension Father   . Healthy Brother      Review of Systems  Positive for weight gain of 5 pounds, headaches, anxiety   Constitutional: negative for anorexia, fevers and sweats  Eyes: negative for irritation, redness and visual disturbance  Ears, nose, mouth, throat, and face: negative for earaches, epistaxis, nasal congestion and sore throat  Respiratory: negative for cough, dyspnea on exertion, sputum and wheezing  Cardiovascular: negative for chest pain, dyspnea, lower extremity edema, orthopnea, palpitations and syncope  Gastrointestinal: negative for abdominal pain, constipation, diarrhea, melena, nausea and vomiting  Genitourinary:negative for dysuria, frequency and hematuria  Hematologic/lymphatic: negative for bleeding, easy bruising and lymphadenopathy  Musculoskeletal:negative for arthralgias, muscle weakness and stiff joints  Neurological: negative for coordination problems, gait problems and weakness  Endocrine: negative for diabetic symptoms including polydipsia, polyuria and weight loss     Objective:   Physical Exam  Gen. Pleasant,  in no distress, normal affect ENT - wide jaw, no post nasal drip, class 2 airway Neck: No JVD, no thyromegaly, no carotid bruits Lungs: no use of accessory muscles, no dullness to percussion, decreased without rales or rhonchi  Cardiovascular: Rhythm regular, heart sounds  normal, no murmurs or gallops, no peripheral edema Abdomen: soft and non-tender, no hepatosplenomegaly, BS  normal. Musculoskeletal: No deformities, no cyanosis or clubbing Neuro:  alert, non focal, no tremors       Assessment & Plan:

## 2017-05-27 ENCOUNTER — Telehealth: Payer: Self-pay | Admitting: Pulmonary Disease

## 2017-05-27 NOTE — Telephone Encounter (Signed)
Advised pt of results. Pt understood and nothing further is needed.   

## 2017-05-27 NOTE — Telephone Encounter (Signed)
Called patient, unable to reach left message to give us a call back. 

## 2017-06-12 DIAGNOSIS — G4733 Obstructive sleep apnea (adult) (pediatric): Secondary | ICD-10-CM | POA: Diagnosis not present

## 2017-06-13 ENCOUNTER — Other Ambulatory Visit: Payer: Self-pay | Admitting: *Deleted

## 2017-06-13 DIAGNOSIS — G4733 Obstructive sleep apnea (adult) (pediatric): Secondary | ICD-10-CM

## 2017-06-19 ENCOUNTER — Telehealth: Payer: Self-pay | Admitting: Pulmonary Disease

## 2017-06-19 NOTE — Telephone Encounter (Signed)
Per RA, HST showed mild OSA with 8 events per hour. Since he is not very symptomatic, would recommend weight loss and follow up in 6 months.   Left message for patient to call back.

## 2017-06-21 NOTE — Telephone Encounter (Signed)
Advised pt of results. Pt understood and nothing further is needed.   

## 2017-06-21 NOTE — Telephone Encounter (Signed)
Pt is returning call. Cb is [phone].

## 2017-10-03 ENCOUNTER — Telehealth: Payer: Self-pay | Admitting: Internal Medicine

## 2017-10-03 NOTE — Telephone Encounter (Signed)
Pt called to report that his BP has been running high and his Pmd Dr. Larose Hires saw him today after increasing his HCTZ a few months ago and his BP is still up some... 161/96, 144/85, 157/85... HR 80's.Marland Kitchen He denies dizziness, light headedness, and sob, but has a headache often. He wants Dr. Tenny Craw to make some med changes but I advised him that he has not been seen in over a year and I could move his 10/22/17 appt up sooner but the pt declined and said he cannot come in sooner.Marland Kitchen He says that he will keep track of his BP and bring with him to his follow up appt. I advised him to call us or Dr. Larose Hires if he develops any other problems.. Pt agrees.

## 2017-10-03 NOTE — Telephone Encounter (Signed)
New Message:    Pt c/o BP issue: STAT if pt c/o blurred vision, one-sided weakness or slurred speech  1. What are your last 5 BP readings? 160/80     158/93  2. Are you having any other symptoms (ex. Dizziness, headache, blurred vision, passed out)? Headache   3. What is your BP issue? High BP     Patient stated his medication has been increased: hydrochlorothiazide (MICROZIDE) 12.5 MG capsule

## 2017-10-04 ENCOUNTER — Telehealth: Payer: Self-pay | Admitting: *Deleted

## 2017-10-04 NOTE — Telephone Encounter (Signed)
Received CBC and CMP results (10/03/17) from [facility]. Patient of Dr. Gweneth Dimitri. Per last note from office visit 03/14/17, patient to be followed by primary and referred back to Hematology if HCT greater than 55% for phlebotomy. HCT result on 10/03/17 54.6%. No further action at this time.

## 2017-10-22 ENCOUNTER — Encounter (INDEPENDENT_AMBULATORY_CARE_PROVIDER_SITE_OTHER): Payer: Self-pay

## 2017-10-22 ENCOUNTER — Encounter: Payer: Self-pay | Admitting: Internal Medicine

## 2017-10-22 ENCOUNTER — Ambulatory Visit: Payer: BLUE CROSS/BLUE SHIELD | Admitting: Internal Medicine

## 2017-10-22 VITALS — BP 132/84 | HR 70 | Ht 72.0 in | Wt 222.8 lb

## 2017-10-22 DIAGNOSIS — E782 Mixed hyperlipidemia: Secondary | ICD-10-CM | POA: Diagnosis not present

## 2017-10-22 DIAGNOSIS — I2510 Atherosclerotic heart disease of native coronary artery without angina pectoris: Secondary | ICD-10-CM | POA: Diagnosis not present

## 2017-10-22 DIAGNOSIS — I10 Essential (primary) hypertension: Secondary | ICD-10-CM

## 2017-10-22 NOTE — Progress Notes (Addendum)
Cardiology Office Note   Date:  10/22/2017   ID:  Corey Archer, DOB 1965/11/28, MRN [id number]  PCP:  Merri Brunette, MD  Cardiologist:   Dietrich Pates, MD   F/U of CAD and CP    History of Present Illness: Corey Archer is a 52 y.o. male with a history of minimal CAD by cath in 2011, tobacco abuse, EtOH, HL and CP   I saw himi n clinic in Feb 2018 Since seen he denies CP   Breathing is OK  Hs some cough     Current Meds  Medication Sig  . b complex vitamins tablet Take 1 tablet by mouth daily.  Marland Kitchen buPROPion (WELLBUTRIN XL) 150 MG 24 hr tablet Take 150 mg by mouth daily.  Marland Kitchen CALCIUM-VITAMIN D PO Take 1 tablet by mouth daily.  . celecoxib (CELEBREX) 200 MG capsule Take 200 mg by mouth daily.  . cyclobenzaprine (FLEXERIL) 10 MG tablet Take 10 mg by mouth 3 (three) times daily as needed for muscle spasms.  . hydrochlorothiazide (HYDRODIURIL) 25 MG tablet Take 25 mg by mouth daily.  . hydrOXYzine (ATARAX/VISTARIL) 25 MG tablet Take 25 mg by mouth at bedtime.  Marland Kitchen LORazepam (ATIVAN) 1 MG tablet Take 1 mg by mouth at bedtime. For anxiety  . metoprolol succinate (TOPROL XL) 50 MG 24 hr tablet Take 1.5 tablets once a day  . Multiple Vitamin (MULTIVITAMIN) tablet Take 1 tablet by mouth daily.  . pantoprazole (PROTONIX) 40 MG tablet Take 40 mg by mouth daily.  . ramipril (ALTACE) 10 MG capsule Take 10 mg by mouth daily.  . sildenafil (REVATIO) 20 MG tablet Take 20 mg by mouth 3 (three) times daily.  . Testosterone 20.25 MG/1.25GM (1.62%) GEL Place onto the skin. 4 pumps once daily     Allergies:   Motrin [ibuprofen] and Tylenol [acetaminophen]   Past Medical History:  Diagnosis Date  . Alcohol abuse   . HLD (hyperlipidemia)   . Hypertension   . Ischemic heart disease   . Tobacco abuse     Past Surgical History:  Procedure Laterality Date  . CARDIAC CATHETERIZATION       Social History:  The patient  reports that he has been smoking cigarettes. He has a 17.50 pack-year smoking  history. He quit smokeless tobacco use about 24 years ago.  His smokeless tobacco use included chew. He reports that he drinks alcohol.   Family History:  The patient's family history includes Arthritis/Rheumatoid in his mother; COPD in his mother; Congestive Heart Failure in his mother; Diabetes in his mother; Emphysema in his mother; Healthy in his brother; Hypertension in his father; Kidney disease in his father.    ROS:  Please see the history of present illness. All other systems are reviewed and  Negative to the above problem except as noted.    PHYSICAL EXAM: VS:  BP 132/84   Pulse 70   Ht 6' (1.829 m)   Wt 222 lb 12.8 oz (101.1 kg)   BMI 30.22 kg/m   120/80 L arm   130/88 R arm   GEN: obese 52 yo, in no acute distress  HEENT: normal  Neck: JVP is normal  No carotid bruits, or masses Cardiac: RRR; no murmurs, rubs, or gallops,no edema  Respiratory:  clear to auscultation bilaterally, normal work of breathing GI: soft, nontender, nondistended, + BS  No hepatomegaly  MS: no deformity Moving all extremities   Skin: warm and dry, no rash Neuro:  Strength and sensation are  intact Psych: euthymic mood, full affect   EKG:  EKG ist ordered today.  SR 70 bpm  Nonspecific ST changes    Lipid Panel    Component Value Date/Time   CHOL  03/13/2009 0230    151        ATP III CLASSIFICATION:  <200     mg/dL   Desirable  161-096  mg/dL   Borderline High  >=045    mg/dL   High          TRIG 409 (H) 03/13/2009 0230   HDL 31 (L) 03/13/2009 0230   CHOLHDL 4.9 03/13/2009 0230   VLDL 56 (H) 03/13/2009 0230   LDLCALC  03/13/2009 0230    64        Total Cholesterol/HDL:CHD Risk Coronary Heart Disease Risk Table                     Men   Women  1/2 Average Risk   3.4   3.3  Average Risk       5.0   4.4  2 X Average Risk   9.6   7.1  3 X Average Risk  23.4   11.0        Use the calculated Patient Ratio above and the CHD Risk Table to determine the patient's CHD Risk.          ATP III CLASSIFICATION (LDL):  <100     mg/dL   Optimal  811-914  mg/dL   Near or Above                    Optimal  130-159  mg/dL   Borderline  782-956  mg/dL   High  >213     mg/dL   Very High      Wt Readings from Last 3 Encounters:  10/22/17 222 lb 12.8 oz (101.1 kg)  05/24/17 224 lb 6.4 oz (101.8 kg)  03/14/17 226 lb 6.4 oz (102.7 kg)      ASSESSMENT AND PLAN:  1  HTN  Pt says his bp is higher as outpt   Follow     2  CAD  No symptoms to sugg angna  Stay active  3  HL   Conitnue statin    Will get labs from preimary MD    Current medicines are reviewed at length with the patient today.  The patient does not have concerns regarding medicines.  Signed, Dietrich Pates, MD  10/22/2017 9:16 AM    [facility] [address] [place], [place], [place]  [address] Phone: [phone]; Fax: [phone]

## 2017-10-22 NOTE — Patient Instructions (Addendum)
Medication Instructions:  Your physician recommends that you continue on your current medications as directed. Please refer to the Current Medication list given to you today.  If you need a refill on your cardiac medications before your next appointment, please call your pharmacy.   Lab work: Take prescription with you to PCP for labs to be drawn at PCP office and faxed to Dr. Tenny Craw at [phone].  If you have labs (blood work) drawn today and your tests are completely normal, you will receive your results only by: Marland Kitchen MyChart Message (if you have MyChart) OR . A paper copy in the mail If you have any lab test that is abnormal or we need to change your treatment, we will call you to review the results.  Testing/Procedures: none  Follow-Up: At [facility], you and your health needs are our priority.  As part of our continuing mission to provide you with exceptional heart care, we have created designated Provider Care Teams.  These Care Teams include your primary Cardiologist (physician) and Advanced Practice Providers (APPs -  Physician Assistants and Nurse Practitioners) who all work together to provide you with the care you need, when you need it.  We will contact you with follow up instructions.  Any Other Special Instructions Will Be Listed Below (If Applicable). none

## 2018-11-27 NOTE — Progress Notes (Signed)
Cardiology Office Note   Date:  11/28/2018   ID:  Corey Archer, DOB [birth year], MRN [id number]  PCP:  Deland Pretty, MD  Cardiologist:   Dorris Carnes, MD   F/U of CAD and CP    History of Present Illness: Corey Archer is a 53 y.o. male with a history of minimal CAD by cath in 2011, tobacco abuse, EtOH, HL and CP   I saw him clinic in Oct 2019    Since seen he denies CP   Breathing is OK   Current Meds  Medication Sig  . b complex vitamins tablet Take 1 tablet by mouth daily.  Marland Kitchen buPROPion (WELLBUTRIN XL) 150 MG 24 hr tablet Take 150 mg by mouth daily.  Marland Kitchen CALCIUM-VITAMIN D PO Take 1 tablet by mouth daily.  . celecoxib (CELEBREX) 200 MG capsule Take 200 mg by mouth daily.  . cyclobenzaprine (FLEXERIL) 10 MG tablet Take 10 mg by mouth 3 (three) times daily as needed for muscle spasms.  . hydrochlorothiazide (HYDRODIURIL) 25 MG tablet Take 25 mg by mouth daily.  . hydrOXYzine (ATARAX/VISTARIL) 25 MG tablet Take 25 mg by mouth at bedtime.  Marland Kitchen LORazepam (ATIVAN) 1 MG tablet Take 1 mg by mouth at bedtime. For anxiety  . metoprolol succinate (TOPROL XL) 50 MG 24 hr tablet Take 1.5 tablets once a day  . Multiple Vitamin (MULTIVITAMIN) tablet Take 1 tablet by mouth daily.  . pantoprazole (PROTONIX) 40 MG tablet Take 40 mg by mouth daily.  . ramipril (ALTACE) 10 MG capsule Take 10 mg by mouth daily.  . sildenafil (REVATIO) 20 MG tablet Take 20 mg by mouth as needed.  . Testosterone 20.25 MG/1.25GM (1.62%) GEL Place onto the skin. 4 pumps once daily     Allergies:   Motrin [ibuprofen] and Tylenol [acetaminophen]   Past Medical History:  Diagnosis Date  . Alcohol abuse   . HLD (hyperlipidemia)   . Hypertension   . Ischemic heart disease   . Tobacco abuse     Past Surgical History:  Procedure Laterality Date  . CARDIAC CATHETERIZATION       Social History:  The patient  reports that he has been smoking cigarettes. He has a 17.50 pack-year smoking history. He quit smokeless  tobacco use about 25 years ago.  His smokeless tobacco use included chew. He reports current alcohol use.   Family History:  The patient's family history includes Arthritis/Rheumatoid in his mother; COPD in his mother; Congestive Heart Failure in his mother; Diabetes in his mother; Emphysema in his mother; Healthy in his brother; Hypertension in his father; Kidney disease in his father.    ROS:  Please see the history of present illness. All other systems are reviewed and  Negative to the above problem except as noted.    PHYSICAL EXAM: VS:  BP (!) 164/96   Pulse 72   Ht 6' (1.829 m)   Wt 222 lb (100.7 kg)   BMI 30.11 kg/m   120/80 L arm   130/88 R arm   GEN: obese 53 yo, in no acute distress  HEENT: normal  Neck: JVP is normal  No carotid bruits, or masses Cardiac: RRR; no murmurs, rubs, or gallops,no edema  Respiratory:  clear to auscultation bilaterally, normal work of breathing GI: soft, nontender, nondistended, + BS  No hepatomegaly  MS: no deformity Moving all extremities   Skin: warm and dry, no rash Neuro:  Strength and sensation are intact Psych: euthymic mood, full affect  EKG:  EKG ist ordered today.  SR 72      Lipid Panel    Component Value Date/Time   CHOL  03/13/2009 0230    151        ATP III CLASSIFICATION:  <200     mg/dL   Desirable  983-382  mg/dL   Borderline High  >=505    mg/dL   High          TRIG 397 (H) 03/13/2009 0230   HDL 31 (L) 03/13/2009 0230   CHOLHDL 4.9 03/13/2009 0230   VLDL 56 (H) 03/13/2009 0230   LDLCALC  03/13/2009 0230    64        Total Cholesterol/HDL:CHD Risk Coronary Heart Disease Risk Table                     Men   Women  1/2 Average Risk   3.4   3.3  Average Risk       5.0   4.4  2 X Average Risk   9.6   7.1  3 X Average Risk  23.4   11.0        Use the calculated Patient Ratio above and the CHD Risk Table to determine the patient's CHD Risk.        ATP III CLASSIFICATION (LDL):  <100     mg/dL   Optimal   673-419  mg/dL   Near or Above                    Optimal  130-159  mg/dL   Borderline  379-024  mg/dL   High  >097     mg/dL   Very High      Wt Readings from Last 3 Encounters:  11/28/18 222 lb (100.7 kg)  10/22/17 222 lb 12.8 oz (101.1 kg)  05/24/17 224 lb 6.4 oz (101.8 kg)      ASSESSMENT AND PLAN:  1  HTN  Pt says his bp is higher at doctors office than at home  WIll need to   Follow     2  CAD  No symptoms to sugg angna  Stay active  3  HL   Conitnue statin    Will get labs from preimary MD    Current medicines are reviewed at length with the patient today.  The patient does not have concerns regarding medicines.  Signed, Dietrich Pates, MD  11/28/2018 3:18 PM    [facility] [address] [place], [place], [place]  [address] Phone: [phone]; Fax: [phone]

## 2018-11-28 ENCOUNTER — Other Ambulatory Visit: Payer: Self-pay

## 2018-11-28 ENCOUNTER — Ambulatory Visit: Payer: BLUE CROSS/BLUE SHIELD | Admitting: Internal Medicine

## 2018-11-28 ENCOUNTER — Encounter: Payer: Self-pay | Admitting: Internal Medicine

## 2018-11-28 VITALS — BP 164/96 | HR 72 | Ht 72.0 in | Wt 222.0 lb

## 2018-11-28 DIAGNOSIS — I2510 Atherosclerotic heart disease of native coronary artery without angina pectoris: Secondary | ICD-10-CM | POA: Diagnosis not present

## 2018-11-28 DIAGNOSIS — E782 Mixed hyperlipidemia: Secondary | ICD-10-CM | POA: Diagnosis not present

## 2018-11-28 DIAGNOSIS — I10 Essential (primary) hypertension: Secondary | ICD-10-CM

## 2018-11-28 NOTE — Patient Instructions (Signed)
Medication Instructions:  No changes *If you need a refill on your cardiac medications before your next appointment, please call your pharmacy*  Lab Work: Bmet, lipids If you have labs (blood work) drawn today and your tests are completely normal, you will receive your results only by: Marland Kitchen MyChart Message (if you have MyChart) OR . A paper copy in the mail If you have any lab test that is abnormal or we need to change your treatment, we will call you to review the results.  Testing/Procedures: No changes  Follow-Up: At [facility], you and your health needs are our priority.  As part of our continuing mission to provide you with exceptional heart care, we have created designated Provider Care Teams.  These Care Teams include your primary Cardiologist (physician) and Advanced Practice Providers (APPs -  Physician Assistants and  Nurse Practitioners) who all work together to provide you with the care you need, when you need it.  Your next appointment:   1 month(s)  The format for your next appointment:   In Person  Provider:   [place].  Please bring your home cuff and list of BP readings with you.  Other Instructions

## 2018-11-29 LAB — BASIC METABOLIC PANEL
BUN/Creatinine Ratio: 13 (ref 9–20)
BUN: 16 mg/dL (ref 6–24)
CO2: 24 mmol/L (ref 20–29)
Calcium: 9.6 mg/dL (ref 8.7–10.2)
Chloride: 104 mmol/L (ref 96–106)
Creatinine, Ser: 1.26 mg/dL (ref 0.76–1.27)
GFR calc Af Amer: 75 mL/min/{1.73_m2} (ref >59)
GFR calc non Af Amer: 65 mL/min/{1.73_m2} (ref >59)
Glucose: 88 mg/dL (ref 65–99)
Potassium: 5.5 mmol/L — ABNORMAL HIGH (ref 3.5–5.2)
Sodium: 143 mmol/L (ref 134–144)

## 2018-11-29 LAB — LIPID PANEL
Chol/HDL Ratio: 2.7 ratio (ref 0.0–5.0)
Cholesterol, Total: 118 mg/dL (ref 100–199)
HDL: 43 mg/dL (ref >39)
LDL Chol Calc (NIH): 43 mg/dL (ref 0–99)
Triglycerides: 199 mg/dL — ABNORMAL HIGH (ref 0–149)
VLDL Cholesterol Cal: 32 mg/dL (ref 5–40)

## 2018-12-03 ENCOUNTER — Telehealth: Payer: Self-pay | Admitting: Internal Medicine

## 2018-12-03 ENCOUNTER — Encounter: Payer: Self-pay | Admitting: Internal Medicine

## 2018-12-03 NOTE — Telephone Encounter (Signed)
Spoke with pt who reports his 1st B/P reading of 198/103 was taken on 11/23 at 3pm, 2nd reading at 5pm of 189/106. 148/102 was at 4am today 12/03/2018.  Pt states he is taking all medications as prescribed and has not missed a dose.  Does not add salt to foods but not necessarily watching NA+ intake.  Pt currently denies any CP, nausea or HA today.  Reports some mild blurred vision but not as bad as 12/01/2018.  Consulted with Dr Celene Squibb) who advises at this point we need additional information as to not over treat pt.  Recommends pt continues to take B/P at least 2-4 hours after he has taken B/P medications and record readings.    Called pt back and advised Dr Tamala Julian requests that he check his B/P 2-4 hours after taking his B/P medications and keep log.  Pt normally takes meds at 4 am and then goes to work but will take his B/P cuff to work and take it there.  Advised pt to rest a few minutes prior to taking his B/P and to have both feet flat on the floor and arm resting on table for most accurate B/P reading. Pt will send readings on 12/08/2018. Pt advised if he develops CP, SOB, weakness or slurred speech he will need to go immediately to ED.  Pt verbalizes understanding and agrees with plan.

## 2018-12-03 NOTE — Telephone Encounter (Signed)
Error

## 2018-12-03 NOTE — Telephone Encounter (Signed)
Pt c/o BP issue: STAT if pt c/o blurred vision, one-sided weakness or slurred speech  1. What are your last 5 BP readings? 198/103 189/106 148/102   2. Are you having any other symptoms (ex. Dizziness, headache, blurred vision, passed out)? Yesterday patient had Headache, Blurred Vision, Fatigue, Nausea. States he does not have symptoms today.    3. What is your BP issue? Patient was told to keep logs of BP and is concerned with how high BP is. Patient was calling from work and only knew the last three readings he had.

## 2018-12-08 ENCOUNTER — Telehealth: Payer: Self-pay | Admitting: Internal Medicine

## 2018-12-08 ENCOUNTER — Other Ambulatory Visit: Payer: Self-pay

## 2018-12-08 MED ORDER — AMLODIPINE BESYLATE 5 MG PO TABS: 5.0000 mg | ORAL_TABLET | Freq: Two times a day (BID) | ORAL | 3 refills | Status: DC

## 2018-12-08 NOTE — Telephone Encounter (Signed)
Will review with Dr. Harrington Challenger and call patient.

## 2018-12-08 NOTE — Progress Notes (Signed)
Pt aware of new Rx-sent to [facility] in [place]. Pt aware to continue recording his BP readings and call back with those results end of week. Pt agreeable.

## 2018-12-08 NOTE — Telephone Encounter (Signed)
Patient calling to report BP readings to see what needs to be done with his medication:  Thursday Nov. 26: 176/94 2hrs after meds  Friday Nov. 27: 164/101 2hrs after meds  Saturday Nov. 28: 151/97 3hrs after meds  Sunday Nov. 29: 154/96 2hrs after meds  Monday Nov. 30: 170/106 2hrs after meds  Monday Nov. 30: 164/104 4hrs after meds

## 2018-12-08 NOTE — Telephone Encounter (Signed)
WOuld add amlodipine 5 mg bid to regimen  Continue other meds  Continue to follow BP

## 2018-12-10 NOTE — Telephone Encounter (Signed)
[name], RN at 12/08/2018 4:13 PM Status: Signed    Pt aware of new Rx-sent to [facility] in [place]. Pt aware to continue recording his BP readings and call back with those results end of week. Pt agreeable.

## 2018-12-10 NOTE — Telephone Encounter (Signed)
See telephone encounter 12/03/18 for further details.

## 2018-12-26 ENCOUNTER — Other Ambulatory Visit: Payer: Self-pay

## 2018-12-26 ENCOUNTER — Ambulatory Visit: Payer: BLUE CROSS/BLUE SHIELD | Admitting: *Deleted

## 2018-12-26 VITALS — BP 146/78 | HR 84 | Resp 18

## 2018-12-26 DIAGNOSIS — Z0130 Encounter for examination of blood pressure without abnormal findings: Secondary | ICD-10-CM

## 2018-12-26 NOTE — Progress Notes (Signed)
1.) Reason for visit: BP check  2.) Name of MD requesting visit: Dr. Harrington Challenger  3.) H&P: OV 11/20 BP was elevated.  Monitored at home.  11/25 called with HIGH readings 198/103, 189/106 amlodipine 5 mg BID was added  4.) ROS related to problem: over the last 507 days BP has started to come down.  Pt brought own cuff which was 147/85.  I got 146/78.  Getting readings at home 145s-130s/80s-90s.  . Discussed sodium and his diet.  He eats a lot of sodium.  He is tolerating amlodipine.   Assessment and plan per MD: to Dr. Harrington Challenger for review/recommendations.  Pt is going to continue to monitor BP.  Sees PCP in Feb.

## 2018-12-28 NOTE — Progress Notes (Signed)
I would recomm cut down on sodium intake to 3 grams per day optimally Follow BP    Use my chart to cmmunicte BP week or 2 after new year

## 2019-10-30 ENCOUNTER — Other Ambulatory Visit: Payer: Self-pay | Admitting: Internal Medicine

## 2019-11-13 ENCOUNTER — Other Ambulatory Visit: Payer: Self-pay | Admitting: Internal Medicine

## 2020-06-28 ENCOUNTER — Other Ambulatory Visit (HOSPITAL_COMMUNITY): Payer: Self-pay | Admitting: Gastroenterology

## 2020-06-28 DIAGNOSIS — R7989 Other specified abnormal findings of blood chemistry: Secondary | ICD-10-CM

## 2020-06-28 DIAGNOSIS — R748 Abnormal levels of other serum enzymes: Secondary | ICD-10-CM

## 2020-06-28 DIAGNOSIS — R945 Abnormal results of liver function studies: Secondary | ICD-10-CM

## 2020-07-06 ENCOUNTER — Other Ambulatory Visit: Payer: Self-pay | Admitting: Radiology

## 2020-07-07 ENCOUNTER — Other Ambulatory Visit: Payer: Self-pay

## 2020-07-07 ENCOUNTER — Ambulatory Visit (HOSPITAL_COMMUNITY)
Admission: RE | Admit: 2020-07-07 | Discharge: 2020-07-07 | Disposition: A | Discharge status: Home / Self Care | Payer: 59 | Source: Ambulatory Visit | Attending: Gastroenterology | Admitting: Gastroenterology

## 2020-07-07 DIAGNOSIS — I10 Essential (primary) hypertension: Secondary | ICD-10-CM | POA: Diagnosis not present

## 2020-07-07 DIAGNOSIS — R945 Abnormal results of liver function studies: Secondary | ICD-10-CM | POA: Insufficient documentation

## 2020-07-07 DIAGNOSIS — Z79899 Other long term (current) drug therapy: Secondary | ICD-10-CM | POA: Insufficient documentation

## 2020-07-07 DIAGNOSIS — Z7982 Long term (current) use of aspirin: Secondary | ICD-10-CM | POA: Insufficient documentation

## 2020-07-07 DIAGNOSIS — Z87891 Personal history of nicotine dependence: Secondary | ICD-10-CM | POA: Insufficient documentation

## 2020-07-07 DIAGNOSIS — R748 Abnormal levels of other serum enzymes: Secondary | ICD-10-CM | POA: Diagnosis present

## 2020-07-07 DIAGNOSIS — Z7901 Long term (current) use of anticoagulants: Secondary | ICD-10-CM | POA: Insufficient documentation

## 2020-07-07 DIAGNOSIS — R7989 Other specified abnormal findings of blood chemistry: Secondary | ICD-10-CM

## 2020-07-07 DIAGNOSIS — I259 Chronic ischemic heart disease, unspecified: Secondary | ICD-10-CM | POA: Diagnosis not present

## 2020-07-07 LAB — CBC
HCT: 48.4 % (ref 39.0–52.0)
Hemoglobin: 16.4 g/dL (ref 13.0–17.0)
MCH: 31.4 pg (ref 26.0–34.0)
MCHC: 33.9 g/dL (ref 30.0–36.0)
MCV: 92.5 fL (ref 80.0–100.0)
Platelets: 166 10*3/uL (ref 150–400)
RBC: 5.23 MIL/uL (ref 4.22–5.81)
RDW: 13.2 % (ref 11.5–15.5)
WBC: 7.9 10*3/uL (ref 4.0–10.5)
nRBC: 0 % (ref 0.0–0.2)

## 2020-07-07 LAB — PROTIME-INR
INR: 1 (ref 0.8–1.2)
Prothrombin Time: 12.7 seconds (ref 11.4–15.2)

## 2020-07-07 MED ORDER — FENTANYL CITRATE (PF) 100 MCG/2ML IJ SOLN
INTRAMUSCULAR | Status: AC
  Filled 2020-07-07: qty 2

## 2020-07-07 MED ORDER — MIDAZOLAM HCL 2 MG/2ML IJ SOLN
INTRAMUSCULAR | Status: AC
  Filled 2020-07-07: qty 2

## 2020-07-07 MED ORDER — FENTANYL CITRATE (PF) 100 MCG/2ML IJ SOLN
INTRAMUSCULAR | Status: AC | PRN
  Administered 2020-07-07: 50 ug via INTRAVENOUS

## 2020-07-07 MED ORDER — LIDOCAINE HCL (PF) 1 % IJ SOLN
INTRAMUSCULAR | Status: AC
  Filled 2020-07-07: qty 30

## 2020-07-07 MED ORDER — MIDAZOLAM HCL 2 MG/2ML IJ SOLN
INTRAMUSCULAR | Status: AC | PRN
  Administered 2020-07-07: 1 mg via INTRAVENOUS

## 2020-07-07 MED ORDER — GELATIN ABSORBABLE 12-7 MM EX MISC
CUTANEOUS | Status: AC
  Filled 2020-07-07: qty 1

## 2020-07-07 MED ORDER — SODIUM CHLORIDE 0.9 % IV SOLN: INTRAVENOUS | Status: DC

## 2020-07-07 NOTE — Procedures (Signed)
Pre Procedure Dx: Elevated LFTs °Post Procedural Dx: Same ° °Technically successful US guided biopsy of right lobe of the liver. ° °EBL: Trace ° °No immediate complications.  ° °Jay Yobany Vroom, MD °Pager #: [phone] ° ° °

## 2020-07-07 NOTE — H&P (Signed)
Chief Complaint: Patient was seen in consultation today for non-focal liver biopsy  Referring Physician(s): Kerin Salen  Supervising Physician: Dr. Grace Isaac  Patient Status: [facility] - Out-pt  History of Present Illness: Corey Archer is a 55 y.o. male with a medical history significant for alcohol and tobacco use, HTN and ischemic heart disease. Recent lab work has shown persistently elevated liver function tests.   Interventional Radiology has been asked to evaluate this patient for an image-guided non-focal liver biopsy for further work up.   Past Medical History:  Diagnosis Date   Alcohol abuse    HLD (hyperlipidemia)    Hypertension    Ischemic heart disease    Tobacco abuse     Past Surgical History:  Procedure Laterality Date   CARDIAC CATHETERIZATION      Allergies: Motrin [ibuprofen] and Tylenol [acetaminophen]  Medications: Prior to Admission medications   Medication Sig Start Date End Date Taking? Authorizing Provider  amLODipine (NORVASC) 5 MG tablet Take 1 tablet (5 mg total) by mouth 2 (two) times daily. Please make overdue appt with Dr. Tenny Craw before anymore refills. Thank you 1st attempt Patient taking differently: Take 5 mg by mouth 2 (two) times daily. 11/13/19  Yes Pricilla Riffle, MD  aspirin EC 81 MG tablet Take 81 mg by mouth daily. Swallow whole.   Yes [provider]  B Complex-C (SUPER B COMPLEX PO) Take 1 tablet by mouth daily.   Yes [provider]  buPROPion (WELLBUTRIN XL) 300 MG 24 hr tablet Take 300 mg by mouth daily. 10/03/17  Yes [provider]  CALCIUM-VITAMIN D PO Take 600 mg by mouth daily. 400 units of D3   Yes [provider]  celecoxib (CELEBREX) 200 MG capsule Take 200 mg by mouth daily.   Yes [provider]  eszopiclone (LUNESTA) 2 MG TABS tablet Take 2 mg by mouth at bedtime. Take immediately before bedtime   Yes [provider]  hydrochlorothiazide (HYDRODIURIL) 25 MG tablet Take 25  mg by mouth daily.   Yes [provider]  LORazepam (ATIVAN) 1 MG tablet Take 0.5 mg by mouth 3 (three) times daily. For anxiety   Yes [provider]  metoprolol succinate (TOPROL XL) 50 MG 24 hr tablet Take 1.5 tablets once a day Patient taking differently: Take 75 mg by mouth daily. 05/12/15  Yes Pricilla Riffle, MD  Multiple Vitamin (MULTIVITAMIN) tablet Take 1 tablet by mouth daily.   Yes [provider]  pantoprazole (PROTONIX) 40 MG tablet Take 40 mg by mouth daily.   Yes [provider]  ramipril (ALTACE) 10 MG capsule Take 10 mg by mouth daily.   Yes [provider]  rosuvastatin (CRESTOR) 20 MG tablet Take 20 mg by mouth daily.   Yes [provider]  sildenafil (VIAGRA) 100 MG tablet Take 100 mg by mouth daily as needed for erectile dysfunction.   Yes [provider]  Testosterone 20.25 MG/1.25GM (1.62%) GEL Place 1 application onto the skin daily at 12 noon. 4 pumps once daily   Yes [provider]     Family History  Problem Relation Age of Onset   Congestive Heart Failure Mother    COPD Mother    Diabetes Mother    Emphysema Mother    Arthritis/Rheumatoid Mother    Kidney disease Father    Hypertension Father    Healthy Brother     Social History   Socioeconomic History   Marital status: Married  Spouse name: Not on file   Number of children: Not on file   Years of education: Not on file   Highest education level: Not on file  Occupational History   Not on file  Tobacco Use   Smoking status: Every Day    Packs/day: 0.50    Years: 35.00    Pack years: 17.50    Types: Cigarettes   Smokeless tobacco: Former    Types: Chew    Quit date: 05/24/1993  Substance and Sexual Activity   Alcohol use: Yes    Alcohol/week: 0.0 standard drinks   Drug use: Not on file   Sexual activity: Not on file  Other Topics Concern   Not on file  Social History Narrative   Not on file   Social Determinants of  Health   Financial Resource Strain: Not on file  Food Insecurity: Not on file  Transportation Needs: Not on file  Physical Activity: Not on file  Stress: Not on file  Social Connections: Not on file    Review of Systems: A 12 point ROS discussed and pertinent positives are indicated in the HPI above.  All other systems are negative.  Review of Systems  Constitutional:  Negative for chills and fatigue.  Respiratory:  Negative for cough and shortness of breath.   Cardiovascular:  Negative for chest pain and leg swelling.  Gastrointestinal:  Negative for abdominal pain, diarrhea, nausea and vomiting.  Neurological:  Negative for dizziness and headaches.   Vital Signs: BP 111/78   Pulse 69   Temp 98.8 F (37.1 C) (Oral)   Ht 6' (1.829 m)   Wt 221 lb (100.2 kg)   SpO2 99%   BMI 29.97 kg/m   Physical Exam Constitutional:      General: He is not in acute distress. HENT:     Mouth/Throat:     Mouth: Mucous membranes are moist.     Pharynx: Oropharynx is clear.  Cardiovascular:     Rate and Rhythm: Normal rate and regular rhythm.     Pulses: Normal pulses.     Heart sounds: Normal heart sounds.  Pulmonary:     Effort: Pulmonary effort is normal.     Breath sounds: Normal breath sounds.  Abdominal:     General: Bowel sounds are normal.     Palpations: Abdomen is soft.     Tenderness: There is no abdominal tenderness.  Musculoskeletal:        General: Normal range of motion.     Right lower leg: No edema.     Left lower leg: No edema.  Skin:    General: Skin is warm and dry.  Neurological:     Mental Status: He is alert and oriented to person, place, and time.    Imaging: No results found.  Labs:  CBC: Recent Labs    07/07/20 1119  WBC 7.9  HGB 16.4  HCT 48.4  PLT 166    COAGS: Recent Labs    07/07/20 1119  INR 1.0    BMP: No results for input(s): NA, K, CL, CO2, GLUCOSE, BUN, CALCIUM, CREATININE, GFRNONAA, GFRAA in the last 8760 hours.  Invalid  input(s): CMP  LIVER FUNCTION TESTS: No results for input(s): BILITOT, AST, ALT, ALKPHOS, PROT, ALBUMIN in the last 8760 hours.  TUMOR MARKERS: No results for input(s): AFPTM, CEA, CA199, CHROMGRNA in the last 8760 hours.  Assessment and Plan:  Elevated LFTs: Corey Archer, 55 year old male, presents today to the [facility] Interventional Radiology  department for an image-guided non-focal liver biopsy for further evaluation of elevated liver function tests.   Risks and benefits of this procedure were discussed with the patient and/or patient's family including, but not limited to bleeding, infection, damage to adjacent structures or low yield requiring additional tests.  All of the questions were answered and there is agreement to proceed. He has been NPO. Labs and vitals have been reviewed.   Consent signed and in chart.  Thank you for this interesting consult.  I greatly enjoyed meeting May Ozment and look forward to participating in their care.  A copy of this report was sent to the requesting provider on this date.  Electronically Signed: Alwyn Ren, AGACNP-BC [phone] 07/07/2020, 12:28 PM   I spent a total of  30 Minutes   in face to face in clinical consultation, greater than 50% of which was counseling/coordinating care for image-guided non-focal liver biopsy.

## 2020-07-07 NOTE — Progress Notes (Signed)
Called and spoke with Grace Isaac, MD who stated for patient to resume Aspirin tomorrow. Patient and wife informed and verbalized understanding.

## 2020-07-13 LAB — SURGICAL PATHOLOGY

## 2020-08-17 ENCOUNTER — Ambulatory Visit: Payer: 59 | Admitting: Neurology

## 2020-09-29 ENCOUNTER — Other Ambulatory Visit: Payer: Self-pay

## 2020-09-29 ENCOUNTER — Ambulatory Visit (INDEPENDENT_AMBULATORY_CARE_PROVIDER_SITE_OTHER): Payer: 59 | Admitting: Internal Medicine

## 2020-09-29 ENCOUNTER — Encounter: Payer: Self-pay | Admitting: Internal Medicine

## 2020-09-29 VITALS — BP 144/82 | HR 73 | Ht 72.0 in | Wt 231.2 lb

## 2020-09-29 DIAGNOSIS — I10 Essential (primary) hypertension: Secondary | ICD-10-CM | POA: Diagnosis not present

## 2020-09-29 MED ORDER — METOPROLOL SUCCINATE ER 50 MG PO TB24: ORAL_TABLET | ORAL | 3 refills | Status: AC

## 2020-09-29 MED ORDER — METOPROLOL SUCCINATE ER 50 MG PO TB24: ORAL_TABLET | ORAL | 3 refills | Status: DC

## 2020-09-29 NOTE — Progress Notes (Signed)
Cardiology Office Note   Date:  09/29/2020   ID:  Jaimie Redditt, DOB March 03, 1965, MRN [id number]  PCP:  Merri Brunette, MD  Cardiologist:   Dietrich Pates, MD   F/U of HTN and CAD    History of Present Illness: Corey Archer is a 55 y.o. male with a history of CP,  minimal CAD by cath in 2011, tobacco abuse,  HTN,  HL and EtOH abouse  I last saw the pt in 2020  SInce seen the pt  The pt denies CP   Breathing is OK   No dizziness  Recently had a liver Bx  done   Waiting to see GI next week The pt has been under increased stress recently    Current Meds  Medication Sig   amLODipine (NORVASC) 5 MG tablet Take 1 tablet (5 mg total) by mouth 2 (two) times daily. Please make overdue appt with Dr. Tenny Craw before anymore refills. Thank you 1st attempt (Patient taking differently: Take 5 mg by mouth 2 (two) times daily.)   aspirin EC 81 MG tablet Take 81 mg by mouth daily. Swallow whole.   B Complex-C (SUPER B COMPLEX PO) Take 1 tablet by mouth daily.   buPROPion (WELLBUTRIN XL) 300 MG 24 hr tablet Take 300 mg by mouth daily.   CALCIUM-VITAMIN D PO Take 600 mg by mouth daily. 400 units of D3   celecoxib (CELEBREX) 200 MG capsule Take 200 mg by mouth daily.   eszopiclone (LUNESTA) 2 MG TABS tablet Take 2 mg by mouth at bedtime. Take immediately before bedtime   hydrochlorothiazide (HYDRODIURIL) 25 MG tablet Take 25 mg by mouth daily.   LORazepam (ATIVAN) 0.5 MG tablet Take 1 tablet by mouth 3 (three) times daily.   metoprolol succinate (TOPROL-XL) 50 MG 24 hr tablet Take 1.5 tablets every AM and one tablet every PM Take with or immediately following a meal.   Multiple Vitamin (MULTIVITAMIN) tablet Take 1 tablet by mouth daily.   omeprazole (PRILOSEC) 40 MG capsule 1 capsule 30 minutes before morning meal   ramipril (ALTACE) 10 MG capsule Take 10 mg by mouth daily.   rosuvastatin (CRESTOR) 20 MG tablet Take 20 mg by mouth daily.   sildenafil (VIAGRA) 100 MG tablet Take 100 mg by mouth daily as  needed for erectile dysfunction.   Testosterone 20.25 MG/1.25GM (1.62%) GEL Place 1 application onto the skin daily at 12 noon. 4 pumps once daily   ursodiol (ACTIGALL) 500 MG tablet Take 500 mg by mouth 2 (two) times daily.   [DISCONTINUED] metoprolol succinate (TOPROL XL) 50 MG 24 hr tablet Take 1.5 tablets once a day (Patient taking differently: Take 75 mg by mouth daily.)   [DISCONTINUED] metoprolol succinate (TOPROL-XL) 50 MG 24 hr tablet Take 1.5 mg tablets every AM and 1 tablet every PM Take with or immediately following a meal.     Allergies:   Motrin [ibuprofen] and Tylenol [acetaminophen]   Past Medical History:  Diagnosis Date   Alcohol abuse    HLD (hyperlipidemia)    Hypertension    Ischemic heart disease    Tobacco abuse     Past Surgical History:  Procedure Laterality Date   CARDIAC CATHETERIZATION       Social History:  The patient  reports that he has been smoking cigarettes. He has a 17.50 pack-year smoking history. He quit smokeless tobacco use about 27 years ago.  His smokeless tobacco use included chew. He reports current alcohol use.   Family History:  The patient's family history includes Arthritis/Rheumatoid in his mother; COPD in his mother; Congestive Heart Failure in his mother; Diabetes in his mother; Emphysema in his mother; Healthy in his brother; Hypertension in his father; Kidney disease in his father.    ROS:  Please see the history of present illness. All other systems are reviewed and  Negative to the above problem except as noted.    PHYSICAL EXAM: VS:  BP (!) 144/82   Pulse 73   Ht 6' (1.829 m)   Wt 231 lb 3.2 oz (104.9 kg)   SpO2 96%   BMI 31.36 kg/m    GEN: obese 55 yo, in no acute distress  HEENT: normal  Neck: JVP is normal  No carotid bruits Cardiac: RRR; no murmurs  No LE edema  Respiratory:  clear to auscultation bilaterally GI: soft, nontender, nondistended, + BS  No hepatomegaly  MS: no deformity Moving all extremities    Skin: warm and dry, no rash Neuro:  Strength and sensation are intact Psych: euthymic mood, full affect   EKG:  EKG ist ordered today.  SR 73 bpm     Lipid Panel    Component Value Date/Time   CHOL 118 11/28/2018 1540   TRIG 199 (H) 11/28/2018 1540   HDL 43 11/28/2018 1540   CHOLHDL 2.7 11/28/2018 1540   CHOLHDL 4.9 03/13/2009 0230   VLDL 56 (H) 03/13/2009 0230   LDLCALC 43 11/28/2018 1540      Wt Readings from Last 3 Encounters:  09/29/20 231 lb 3.2 oz (104.9 kg)  07/07/20 221 lb (100.2 kg)  11/28/18 222 lb (100.7 kg)      ASSESSMENT AND PLAN:  1  HTN  Pt's BP is up   Will increase meds by adding metoprolol XL 50 mg at night   Follow BP over time   Will set to see the pt this winter   2  CAD Minimal CAD at cath   Follow   No symptoms    3  HL   Conitnue statin    Last LDL 66  HDL 54   Keep on statin (Crestor 20 mg)     Encouraged the pt to stay active.    Current medicines are reviewed at length with the patient today.  The patient does not have concerns regarding medicines.  Signed, Dietrich Pates, MD  09/29/2020 8:12 PM    [facility] [address] [place], [place], [place]  [address] Phone: [phone]; Fax: [phone]

## 2020-09-29 NOTE — Patient Instructions (Signed)
Medication Instructions:  Your physician has recommended you make the following change in your medication: Increase metoprolol succinate to 75 mg in the AM and 50 mg in the PM   *If you need a refill on your cardiac medications before your next appointment, please call your pharmacy*   Lab Work: none If you have labs (blood work) drawn today and your tests are completely normal, you will receive your results only by: MyChart Message (if you have MyChart) OR A paper copy in the mail If you have any lab test that is abnormal or we need to change your treatment, we will call you to review the results.   Testing/Procedures: none   Follow-Up: At [facility], you and your health needs are our priority.  As part of our continuing mission to provide you with exceptional heart care, we have created designated Provider Care Teams.  These Care Teams include your primary Cardiologist (physician) and Advanced Practice Providers (APPs -  Physician Assistants and Nurse Practitioners) who all work together to provide you with the care you need, when you need it.  We recommend signing up for the patient portal called "MyChart".  Sign up information is provided on this After Visit Summary.  MyChart is used to connect with patients for Virtual Visits (Telemedicine).  Patients are able to view lab/test results, encounter notes, upcoming appointments, etc.  Non-urgent messages can be sent to your provider as well.   To learn more about what you can do with MyChart, go to [email or web address].    Your next appointment:   May/June 2023  The format for your next appointment:   In Person  Provider:   You may see Dietrich Pates, MD or one of the following Advanced Practice Providers on your designated Care Team:   Tereso Newcomer, PA-C [place] Aus, [place]   Other Instructions

## 2021-08-23 DIAGNOSIS — R748 Abnormal levels of other serum enzymes: Secondary | ICD-10-CM | POA: Diagnosis not present

## 2021-08-23 DIAGNOSIS — Z125 Encounter for screening for malignant neoplasm of prostate: Secondary | ICD-10-CM | POA: Diagnosis not present

## 2021-08-23 DIAGNOSIS — Z0001 Encounter for general adult medical examination with abnormal findings: Secondary | ICD-10-CM | POA: Diagnosis not present

## 2021-08-30 DIAGNOSIS — Z0000 Encounter for general adult medical examination without abnormal findings: Secondary | ICD-10-CM | POA: Diagnosis not present

## 2021-08-30 DIAGNOSIS — I2510 Atherosclerotic heart disease of native coronary artery without angina pectoris: Secondary | ICD-10-CM | POA: Diagnosis not present

## 2021-08-30 DIAGNOSIS — F329 Major depressive disorder, single episode, unspecified: Secondary | ICD-10-CM | POA: Diagnosis not present

## 2021-08-30 DIAGNOSIS — N529 Male erectile dysfunction, unspecified: Secondary | ICD-10-CM | POA: Diagnosis not present

## 2021-08-30 DIAGNOSIS — I10 Essential (primary) hypertension: Secondary | ICD-10-CM | POA: Diagnosis not present

## 2021-08-30 DIAGNOSIS — E291 Testicular hypofunction: Secondary | ICD-10-CM | POA: Diagnosis not present

## 2021-08-31 ENCOUNTER — Other Ambulatory Visit: Payer: Self-pay | Admitting: Internal Medicine

## 2021-08-31 DIAGNOSIS — F17210 Nicotine dependence, cigarettes, uncomplicated: Secondary | ICD-10-CM

## 2021-09-03 IMAGING — US US BIOPSY CORE LIVER
1 series · 10 of 10 positions shown · non-contrast
Comparison: Right upper quadrant abdominal ultrasound-03/13/2017

INDICATION: Elevated LFTs of uncertain etiology. Please perform
ultrasound-guided liver biopsy for tissue diagnostic purposes.

EXAM:
ULTRASOUND GUIDED LIVER BIOPSY

[Series 1: us biopsy (liver) · 10 of 10 slices shown]
[im 1/10]
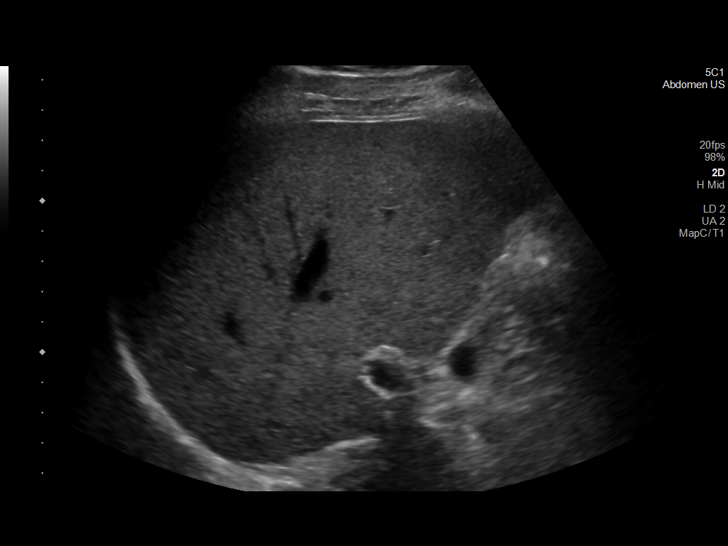
[im 2/10]
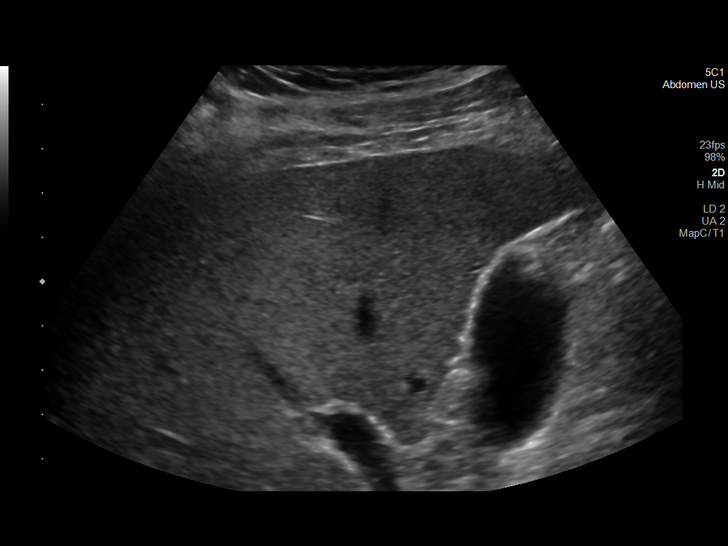
[im 3/10]
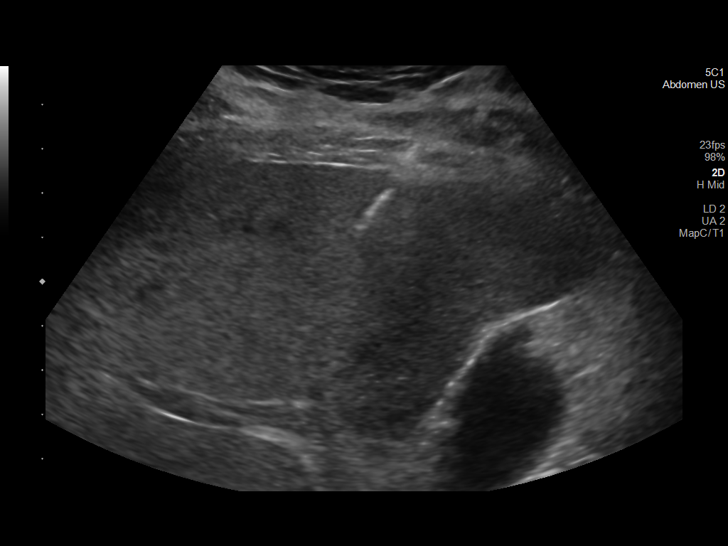
[im 4/10]
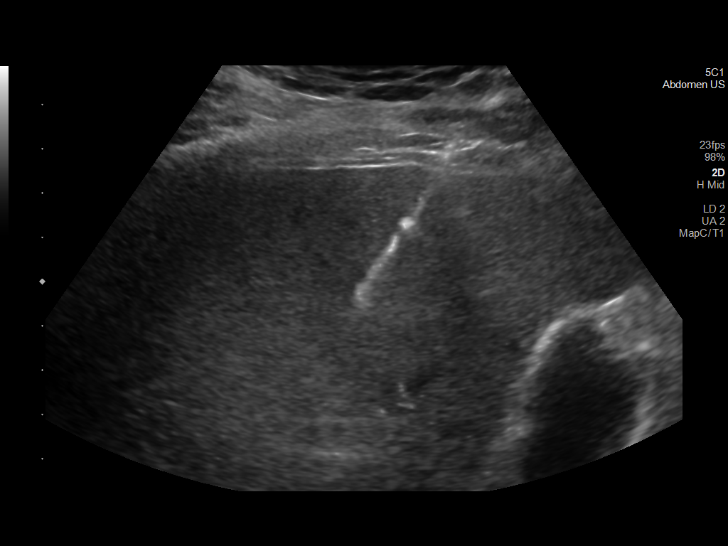
[im 5/10]
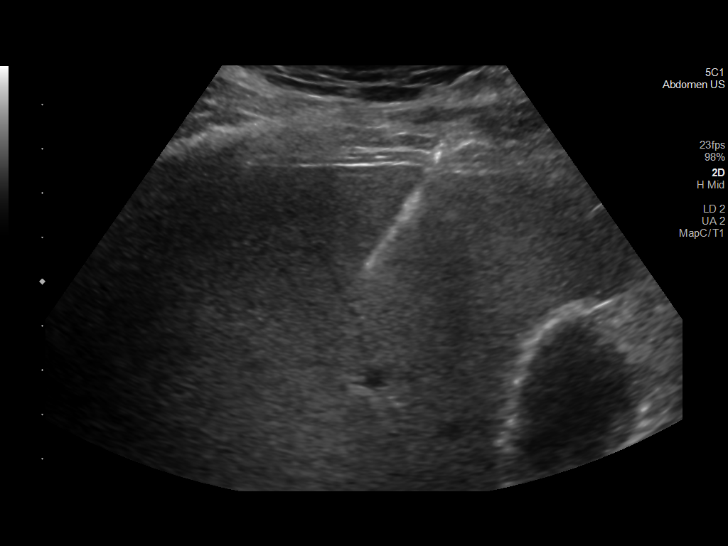
[im 6/10]
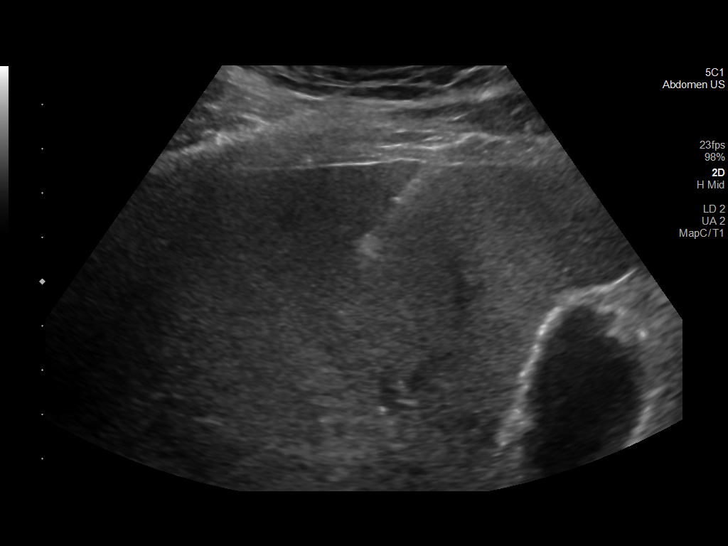
[im 7/10]
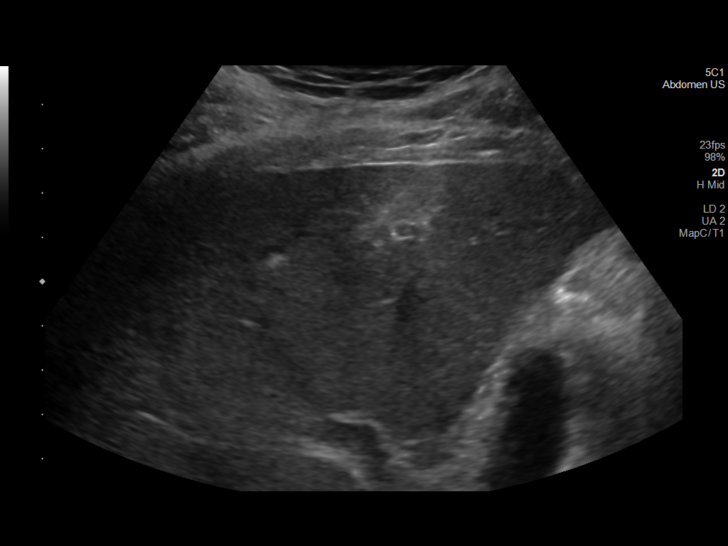
[im 8/10]
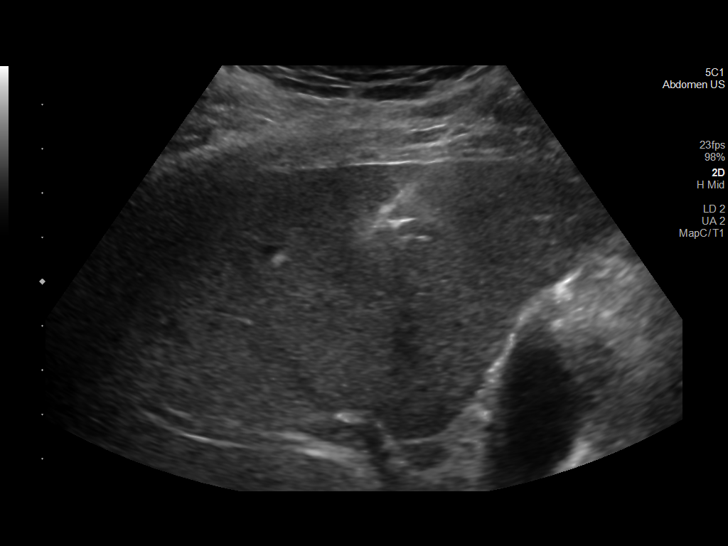
[im 9/10]
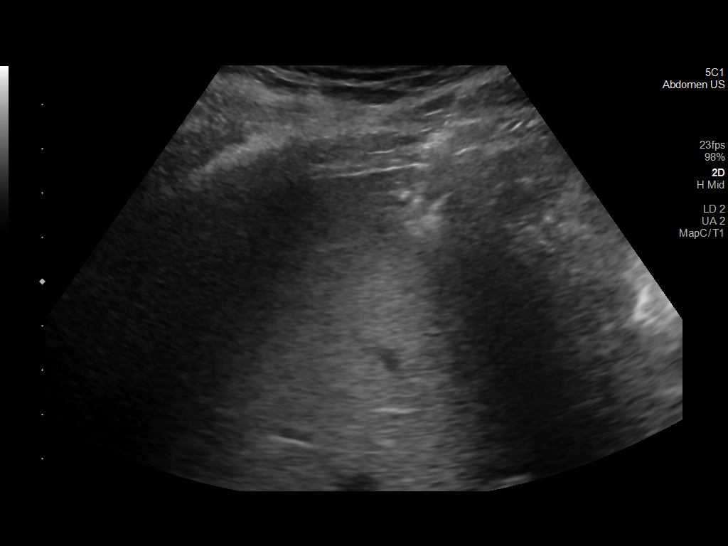
[im 10/10]
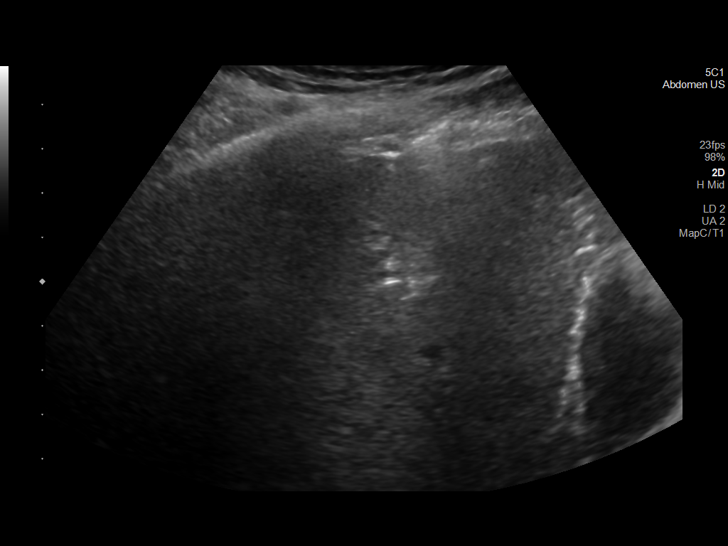

[10 of 10 positions shown; findings below may reference images not displayed]

MEDICATIONS:
None

ANESTHESIA/SEDATION:
Fentanyl 100 mcg IV; Versed 2 mg IV

Total Moderate Sedation time: 11 minutes; The patient was
continuously monitored during the procedure by the interventional
radiology nurse under my direct supervision.

COMPLICATIONS:
None immediate.

PROCEDURE:
Informed written consent was obtained from the patient after a
discussion of the risks, benefits and alternatives to treatment. The
patient understands and consents the procedure. A timeout was
performed prior to the initiation of the procedure.

Ultrasound scanning was performed of the right upper abdominal
quadrant and the procedure was planned. The right upper abdomen was
prepped and draped in the usual sterile fashion. The overlying soft
tissues were anesthetized with 1% lidocaine with epinephrine. A 17
gauge, 6.8 cm co-axial needle was advanced into a peripheral aspect
of the right lobe of the liver and 3 core biopsies were obtained
with an 18 gauge core device under direct ultrasound guidance.

The co-axial needle track was embolized with the administration of a
Gel-Foam slurry. Superficial hemostasis was obtained with manual
compression. Post procedural scanning was negative for definitive
area of hemorrhage. A dressing was placed. The patient tolerated the
procedure well without immediate post procedural complication.
IMPRESSION: Technically successful ultrasound guided liver biopsy.

## 2021-09-27 DIAGNOSIS — M1A09X0 Idiopathic chronic gout, multiple sites, without tophus (tophi): Secondary | ICD-10-CM | POA: Diagnosis not present

## 2021-11-13 ENCOUNTER — Ambulatory Visit
Admission: RE | Admit: 2021-11-13 | Discharge: 2021-11-13 | Disposition: A | Discharge status: Home / Self Care | Payer: BC Managed Care – PPO | Source: Ambulatory Visit | Attending: Internal Medicine | Admitting: Internal Medicine

## 2021-11-13 DIAGNOSIS — F17210 Nicotine dependence, cigarettes, uncomplicated: Secondary | ICD-10-CM

## 2022-05-07 DIAGNOSIS — K743 Primary biliary cirrhosis: Secondary | ICD-10-CM | POA: Diagnosis not present

## 2022-05-10 ENCOUNTER — Other Ambulatory Visit: Payer: Self-pay | Admitting: Gastroenterology

## 2022-05-10 ENCOUNTER — Encounter: Payer: Self-pay | Admitting: Gastroenterology

## 2022-05-10 DIAGNOSIS — R7989 Other specified abnormal findings of blood chemistry: Secondary | ICD-10-CM

## 2022-05-10 DIAGNOSIS — D696 Thrombocytopenia, unspecified: Secondary | ICD-10-CM

## 2022-05-10 DIAGNOSIS — K743 Primary biliary cirrhosis: Secondary | ICD-10-CM

## 2022-06-15 ENCOUNTER — Ambulatory Visit
Admission: RE | Admit: 2022-06-15 | Discharge: 2022-06-15 | Disposition: A | Discharge status: Home / Self Care | Payer: BC Managed Care – PPO | Source: Ambulatory Visit | Attending: Gastroenterology

## 2022-06-15 DIAGNOSIS — R7989 Other specified abnormal findings of blood chemistry: Secondary | ICD-10-CM

## 2022-06-15 DIAGNOSIS — D696 Thrombocytopenia, unspecified: Secondary | ICD-10-CM

## 2022-06-15 DIAGNOSIS — K743 Primary biliary cirrhosis: Secondary | ICD-10-CM

## 2022-06-15 DIAGNOSIS — R945 Abnormal results of liver function studies: Secondary | ICD-10-CM | POA: Diagnosis not present

## 2022-07-09 DIAGNOSIS — R635 Abnormal weight gain: Secondary | ICD-10-CM | POA: Diagnosis not present

## 2022-07-09 DIAGNOSIS — Z86010 Personal history of colonic polyps: Secondary | ICD-10-CM | POA: Diagnosis not present

## 2022-07-09 DIAGNOSIS — K743 Primary biliary cirrhosis: Secondary | ICD-10-CM | POA: Diagnosis not present

## 2022-07-24 DIAGNOSIS — D122 Benign neoplasm of ascending colon: Secondary | ICD-10-CM | POA: Diagnosis not present

## 2022-07-24 DIAGNOSIS — K6389 Other specified diseases of intestine: Secondary | ICD-10-CM | POA: Diagnosis not present

## 2022-07-24 DIAGNOSIS — K648 Other hemorrhoids: Secondary | ICD-10-CM | POA: Diagnosis not present

## 2022-07-24 DIAGNOSIS — Z86010 Personal history of colonic polyps: Secondary | ICD-10-CM | POA: Diagnosis not present

## 2022-08-01 DIAGNOSIS — D485 Neoplasm of uncertain behavior of skin: Secondary | ICD-10-CM | POA: Diagnosis not present

## 2022-08-01 DIAGNOSIS — Z1283 Encounter for screening for malignant neoplasm of skin: Secondary | ICD-10-CM | POA: Diagnosis not present

## 2022-08-01 DIAGNOSIS — D225 Melanocytic nevi of trunk: Secondary | ICD-10-CM | POA: Diagnosis not present

## 2022-08-14 DIAGNOSIS — D485 Neoplasm of uncertain behavior of skin: Secondary | ICD-10-CM | POA: Diagnosis not present

## 2022-08-31 DIAGNOSIS — Z125 Encounter for screening for malignant neoplasm of prostate: Secondary | ICD-10-CM | POA: Diagnosis not present

## 2022-08-31 DIAGNOSIS — Z0001 Encounter for general adult medical examination with abnormal findings: Secondary | ICD-10-CM | POA: Diagnosis not present

## 2022-08-31 DIAGNOSIS — R7303 Prediabetes: Secondary | ICD-10-CM | POA: Diagnosis not present

## 2022-09-05 DIAGNOSIS — I2510 Atherosclerotic heart disease of native coronary artery without angina pectoris: Secondary | ICD-10-CM | POA: Diagnosis not present

## 2022-09-05 DIAGNOSIS — F329 Major depressive disorder, single episode, unspecified: Secondary | ICD-10-CM | POA: Diagnosis not present

## 2022-09-05 DIAGNOSIS — K743 Primary biliary cirrhosis: Secondary | ICD-10-CM | POA: Diagnosis not present

## 2022-09-05 DIAGNOSIS — Z0000 Encounter for general adult medical examination without abnormal findings: Secondary | ICD-10-CM | POA: Diagnosis not present

## 2022-09-05 DIAGNOSIS — M1A09X0 Idiopathic chronic gout, multiple sites, without tophus (tophi): Secondary | ICD-10-CM | POA: Diagnosis not present

## 2022-09-05 DIAGNOSIS — M2550 Pain in unspecified joint: Secondary | ICD-10-CM | POA: Diagnosis not present

## 2022-09-05 DIAGNOSIS — F17210 Nicotine dependence, cigarettes, uncomplicated: Secondary | ICD-10-CM | POA: Diagnosis not present

## 2022-09-26 DIAGNOSIS — I2510 Atherosclerotic heart disease of native coronary artery without angina pectoris: Secondary | ICD-10-CM | POA: Diagnosis not present

## 2022-10-31 DIAGNOSIS — F329 Major depressive disorder, single episode, unspecified: Secondary | ICD-10-CM | POA: Diagnosis not present

## 2022-10-31 DIAGNOSIS — I10 Essential (primary) hypertension: Secondary | ICD-10-CM | POA: Diagnosis not present

## 2022-11-06 ENCOUNTER — Other Ambulatory Visit: Payer: Self-pay | Admitting: Internal Medicine

## 2022-11-06 DIAGNOSIS — F17210 Nicotine dependence, cigarettes, uncomplicated: Secondary | ICD-10-CM

## 2022-11-07 ENCOUNTER — Encounter: Payer: Self-pay | Admitting: Internal Medicine

## 2022-11-15 ENCOUNTER — Ambulatory Visit
Admission: RE | Admit: 2022-11-15 | Discharge: 2022-11-15 | Disposition: A | Discharge status: Home / Self Care | Payer: BC Managed Care – PPO | Source: Ambulatory Visit | Attending: Internal Medicine | Admitting: Internal Medicine

## 2022-11-15 DIAGNOSIS — Z87891 Personal history of nicotine dependence: Secondary | ICD-10-CM | POA: Diagnosis not present

## 2022-11-15 DIAGNOSIS — F17210 Nicotine dependence, cigarettes, uncomplicated: Secondary | ICD-10-CM

## 2022-12-12 DIAGNOSIS — I1A0 Resistant hypertension: Secondary | ICD-10-CM | POA: Diagnosis not present

## 2022-12-19 DIAGNOSIS — I1A0 Resistant hypertension: Secondary | ICD-10-CM | POA: Diagnosis not present

## 2023-10-03 LAB — LAB REPORT - SCANNED
A1c: 5.8
Albumin, Urine POC: 8.4
Creatinine, POC: 98.6 mg/dL
EGFR: 93
Microalb Creat Ratio: 9
PSA, Total: 0.3

## 2023-10-10 ENCOUNTER — Other Ambulatory Visit: Payer: Self-pay | Admitting: Internal Medicine

## 2023-10-10 DIAGNOSIS — Z720 Tobacco use: Secondary | ICD-10-CM

## 2023-10-16 ENCOUNTER — Other Ambulatory Visit: Payer: Self-pay | Admitting: Gastroenterology

## 2023-10-16 DIAGNOSIS — K743 Primary biliary cirrhosis: Secondary | ICD-10-CM

## 2023-10-25 ENCOUNTER — Ambulatory Visit
Admission: RE | Admit: 2023-10-25 | Discharge: 2023-10-25 | Disposition: A | Discharge status: Home / Self Care | Source: Ambulatory Visit | Attending: Gastroenterology | Admitting: Gastroenterology

## 2023-10-25 DIAGNOSIS — K743 Primary biliary cirrhosis: Secondary | ICD-10-CM

## 2023-11-19 ENCOUNTER — Inpatient Hospital Stay
Admission: RE | Admit: 2023-11-19 | Discharge: 2023-11-19 | Disposition: A | Source: Ambulatory Visit | Attending: Internal Medicine | Admitting: Internal Medicine

## 2023-11-19 DIAGNOSIS — Z720 Tobacco use: Secondary | ICD-10-CM
